# Patient Record
Sex: Male | Born: 1940 | Race: White | Hispanic: No | Marital: Married | Smoking: Former smoker
Health system: Southern US, Community
[De-identification: ages and names within clinical notes are randomized; demographics above are authoritative.]

## PROBLEM LIST (undated history)

## (undated) DIAGNOSIS — R739 Hyperglycemia, unspecified: Secondary | ICD-10-CM

## (undated) DIAGNOSIS — I10 Essential (primary) hypertension: Secondary | ICD-10-CM

## (undated) DIAGNOSIS — C801 Malignant (primary) neoplasm, unspecified: Secondary | ICD-10-CM

## (undated) DIAGNOSIS — Z87442 Personal history of urinary calculi: Secondary | ICD-10-CM

## (undated) HISTORY — PX: TONSILLECTOMY: SUR1361

## (undated) HISTORY — PX: APPENDECTOMY: SHX54

## (undated) HISTORY — PX: KNEE ARTHROSCOPY: SHX127

## (undated) HISTORY — PX: VASECTOMY: SHX75

---

## 2013-10-27 ENCOUNTER — Encounter: Payer: Self-pay | Admitting: Radiation Oncology

## 2013-10-28 ENCOUNTER — Ambulatory Visit
Admission: RE | Admit: 2013-10-28 | Discharge: 2013-10-28 | Disposition: A | Discharge status: Home / Self Care | Payer: Self-pay | Source: Ambulatory Visit | Attending: Radiation Oncology | Admitting: Radiation Oncology

## 2013-10-28 ENCOUNTER — Ambulatory Visit: Payer: Medicare HMO

## 2013-12-07 ENCOUNTER — Other Ambulatory Visit: Payer: Self-pay | Admitting: Urology

## 2013-12-17 ENCOUNTER — Encounter (HOSPITAL_COMMUNITY): Payer: Self-pay | Admitting: Pharmacy Technician

## 2013-12-23 ENCOUNTER — Encounter (HOSPITAL_COMMUNITY): Payer: Self-pay

## 2013-12-23 ENCOUNTER — Encounter (HOSPITAL_COMMUNITY)
Admission: RE | Admit: 2013-12-23 | Discharge: 2013-12-23 | Disposition: A | Discharge status: Home / Self Care | Payer: Medicare HMO | Source: Ambulatory Visit | Attending: Urology | Admitting: Urology

## 2013-12-23 ENCOUNTER — Ambulatory Visit (HOSPITAL_COMMUNITY)
Admission: RE | Admit: 2013-12-23 | Discharge: 2013-12-23 | Disposition: A | Discharge status: Home / Self Care | Payer: Medicare HMO | Source: Ambulatory Visit | Attending: Urology | Admitting: Urology

## 2013-12-23 DIAGNOSIS — M47814 Spondylosis without myelopathy or radiculopathy, thoracic region: Secondary | ICD-10-CM | POA: Insufficient documentation

## 2013-12-23 DIAGNOSIS — R739 Hyperglycemia, unspecified: Secondary | ICD-10-CM

## 2013-12-23 DIAGNOSIS — I498 Other specified cardiac arrhythmias: Secondary | ICD-10-CM | POA: Insufficient documentation

## 2013-12-23 DIAGNOSIS — C801 Malignant (primary) neoplasm, unspecified: Secondary | ICD-10-CM

## 2013-12-23 DIAGNOSIS — Z01810 Encounter for preprocedural cardiovascular examination: Secondary | ICD-10-CM | POA: Insufficient documentation

## 2013-12-23 DIAGNOSIS — Z01812 Encounter for preprocedural laboratory examination: Secondary | ICD-10-CM | POA: Insufficient documentation

## 2013-12-23 DIAGNOSIS — C61 Malignant neoplasm of prostate: Secondary | ICD-10-CM | POA: Insufficient documentation

## 2013-12-23 DIAGNOSIS — Z01818 Encounter for other preprocedural examination: Secondary | ICD-10-CM | POA: Insufficient documentation

## 2013-12-23 DIAGNOSIS — F17200 Nicotine dependence, unspecified, uncomplicated: Secondary | ICD-10-CM | POA: Insufficient documentation

## 2013-12-23 HISTORY — DX: Personal history of urinary calculi: Z87.442

## 2013-12-23 HISTORY — DX: Hyperglycemia, unspecified: R73.9

## 2013-12-23 HISTORY — DX: Essential (primary) hypertension: I10

## 2013-12-23 HISTORY — DX: Malignant (primary) neoplasm, unspecified: C80.1

## 2013-12-23 LAB — BASIC METABOLIC PANEL
BUN: 16 mg/dL (ref 6–23)
CHLORIDE: 104 meq/L (ref 96–112)
CO2: 26 mEq/L (ref 19–32)
CREATININE: 0.88 mg/dL (ref 0.50–1.35)
Calcium: 9.3 mg/dL (ref 8.4–10.5)
GFR calc Af Amer: >90 mL/min (ref >90)
GFR calc non Af Amer: 84 mL/min — ABNORMAL LOW (ref >90)
Glucose, Bld: 117 mg/dL — ABNORMAL HIGH (ref 70–99)
Potassium: 3.8 mEq/L (ref 3.7–5.3)
Sodium: 141 mEq/L (ref 137–147)

## 2013-12-23 LAB — CBC
HEMATOCRIT: 53 % — AB (ref 39.0–52.0)
Hemoglobin: 18.2 g/dL — ABNORMAL HIGH (ref 13.0–17.0)
MCH: 31.1 pg (ref 26.0–34.0)
MCHC: 34.3 g/dL (ref 30.0–36.0)
MCV: 90.6 fL (ref 78.0–100.0)
Platelets: 271 10*3/uL (ref 150–400)
RBC: 5.85 MIL/uL — ABNORMAL HIGH (ref 4.22–5.81)
RDW: 14 % (ref 11.5–15.5)
WBC: 7.3 10*3/uL (ref 4.0–10.5)

## 2013-12-23 NOTE — Pre-Procedure Instructions (Signed)
12-23-13 CXR/ EKG done today.

## 2013-12-23 NOTE — Patient Instructions (Addendum)
[place]  12/23/2013   Your procedure is scheduled on:   -12-30-2013  Report to [facility] at     [place]   AM.  Call this number if you have problems the morning of surgery: [phone]  Or Presurgical Testing [phone](Amorita Vanrossum) For Living Will and/or Health Care Power Attorney Forms: please provide copy for your medical record,may bring AM of surgery(Forms should be already notarized -we do not provide this service).  Remember: Follow any bowel prep instructions per MD office. For Cpap use: Bring mask and tubing only.   Do not eat food:After Midnight.    Take these medicines the morning of surgery with A SIP OF WATER: none.   Do not wear jewelry, make-up or nail polish.  Do not wear lotions, powders, or perfumes. You may wear deodorant.  Do not shave 48 hours(2 days) prior to first CHG shower(legs and under arms).(Shaving face and neck okay.)  Do not bring valuables to the hospital.(Hospital is not responsible for lost valuables).  Contacts, dentures or removable bridgework, body piercing, hair pins may not be worn into surgery.  Leave suitcase in the car. After surgery it may be brought to your room.  For patients admitted to the hospital, checkout time is 11:00 AM the day of discharge.(Restricted visitors-Any Persons displaying flu-like symptoms or illness).    Patients discharged the day of surgery will not be allowed to drive home. Must have responsible person with you x 24 hours once discharged.  Name and phone number of your driver: Ediel Unangst- spouse [phone] cell  Special Instructions: CHG(Chlorhedine 4%-"Hibiclens","Betasept","Aplicare") Shower Use Special Wash: see special instructions.(avoid face and genitals)   Please read over the following fact sheets that you were given: Blood Transfusion fact sheet, Incentive Spirometry Instruction.  Remember : Type/Screen "Blue armbands" - may not be removed once applied(would result in being retested AM of  surgery, if removed).  Failure to follow these instructions may result in Cancellation of your surgery.   Patient signature_______________________________________________________

## 2013-12-29 NOTE — H&P (Signed)
  History of Present Illness Benjamin Krueger is a 73 year old gentleman who was noted to have an elevated PSA of 5.4 prompting a prostate needle biopsy on 10/07/13 which demonstrated Gleason 3+4=7 adenocarcinoma of the prostate with 5 out of 12 biopsy cores positive for malignancy. Dr. Nila Nephew ordered a CT scan of the abdomen and pelvis on 10/19/13 which did not demonstrate pelvic lymphadenopathy to suggest metastatic disease but did demonstrate a 1.5 cm right inguinal lymph node and a 1.6 cm left inguinal lymph node. These nodes were apparently palpable but have resolved after antibiotic therapy. Benjamin Krueger is very healthy with hypertension as his only comorbid condition.   TNM stage: cT2a N0 Mx (mild induration of right medial base) PSA: 5.4 Gleason score: 3+4=7 Biopsy (10/07/13 - read by Dr. Otho Darner, [facility], Acc # [id number])   Right: R apex (75%, 3+4=7), R lateral apex (50%, 3+3=6), R mid (85%, 3+4=7), R lateral mid (90%, 3+4=7), R base (75%, 3+4=7) Prostate volume: 32 cc  Nomogram OC disease: 63% EPE: 34% LNI: 4% SVI: 5% PFS (surgery): 83% at 5 years, 72% at 10 years   Urinary function: He has moderate symptoms including urinary urgency, frequency, and a weak stream. IPSS is 13 Erectile function: He has severe erectile dysfunction. SHIM score is 21 but he states that he has a very difficult time obtaining an erection when speaking with him.   Past Medical History Problems  1. History of arthritis (V13.4) 2. History of diabetes mellitus (V12.29) 3. History of hypertension (V12.59) 4. History of malignant neoplasm (V10.90)  Surgical History Problems  1. History of Knee Surgery Left  Current Meds 1. Aspirin 81 MG Oral Tablet;  Therapy: (Recorded:24Feb2015) to Recorded 2. Cinnamon CAPS;  Therapy: (Recorded:24Feb2015) to Recorded 3. Hydrochlorothiazide 25 MG Oral Tablet;  Therapy: [id number] to Recorded  Allergies Medication  1. No Known Drug Allergies  Family  History Problems  1. No pertinent family history : Mother  Social History Problems    Denied: History of Alcohol use   Caffeine use (V49.89)   Current every day smoker (305.1)   Death in the family, father   age 62 respiratory   Death in the family, mother   age 47   Married   Retired   Two children  Review of Systems Constitutional, skin, eye, otolaryngeal, hematologic/lymphatic, cardiovascular, pulmonary, endocrine, musculoskeletal, gastrointestinal, neurological and psychiatric system(s) were reviewed and pertinent findings if present are noted.  Constitutional: night sweats.  Integumentary: pruritus.  Musculoskeletal: joint pain.    Vitals Vi Height: 5 ft 10 in Weight: 192 lb  BMI Calculated: 27.55 BSA Calculated: 2.05   Physical Exam Constitutional: Well nourished and well developed . No acute distress.  ENT:. The ears and nose are normal in appearance.  Neck: The appearance of the neck is normal and no neck mass is present.  Pulmonary: No respiratory distress, normal respiratory rhythm and effort and clear bilateral breath sounds.  Cardiovascular: Heart rate and rhythm are normal . No peripheral edema.  Abdomen: The abdomen is soft and nontender. No masses are palpated. No CVA tenderness. No hernias are palpable. No hepatosplenomegaly noted.        Assessment Assessed  1. Prostate cancer (185)    Discussion/Summary 1. Prostate Cancer: He will undergo a left nerve sparing robotic assisted laparoscopic radical prostatectomy and bilateral pelvic lymphadenectomy.

## 2013-12-29 NOTE — Anesthesia Preprocedure Evaluation (Addendum)
Anesthesia Evaluation  Patient identified by MRN, date of birth, ID band Patient awake    Reviewed: Allergy & Precautions, H&P , NPO status , Patient's Chart, lab work & pertinent test results  Airway Mallampati: II TM Distance: >3 FB Neck ROM: Full    Dental  (+) Edentulous Upper, Poor Dentition, Caps, Dental Advisory Given   Pulmonary neg pulmonary ROS, Current Smoker,  breath sounds clear to auscultation  Pulmonary exam normal       Cardiovascular hypertension, Pt. on medications negative cardio ROS  Rhythm:Regular Rate:Normal     Neuro/Psych negative neurological ROS  negative psych ROS   GI/Hepatic negative GI ROS, Neg liver ROS,   Endo/Other  negative endocrine ROS  Renal/GU negative Renal ROS  negative genitourinary   Musculoskeletal negative musculoskeletal ROS (+)   Abdominal   Peds  Hematology negative hematology ROS (+)   Anesthesia Other Findings   Reproductive/Obstetrics                          Anesthesia Physical Anesthesia Plan  ASA: II  Anesthesia Plan: General   Post-op Pain Management:    Induction: Intravenous  Airway Management Planned: Oral ETT  Additional Equipment:   Intra-op Plan:   Post-operative Plan: Extubation in OR  Informed Consent: I have reviewed the patients History and Physical, chart, labs and discussed the procedure including the risks, benefits and alternatives for the proposed anesthesia with the patient or authorized representative who has indicated his/her understanding and acceptance.   Dental advisory given  Plan Discussed with: CRNA  Anesthesia Plan Comments:         Anesthesia Quick Evaluation

## 2013-12-30 ENCOUNTER — Inpatient Hospital Stay (HOSPITAL_COMMUNITY)
Admission: RE | Admit: 2013-12-30 | Discharge: 2013-12-31 | DRG: 708 | Disposition: A | Discharge status: Home / Self Care | Payer: Medicare HMO | Source: Ambulatory Visit | Attending: Urology | Admitting: Urology

## 2013-12-30 ENCOUNTER — Inpatient Hospital Stay (HOSPITAL_COMMUNITY): Payer: Medicare HMO | Admitting: Anesthesiology

## 2013-12-30 ENCOUNTER — Encounter (HOSPITAL_COMMUNITY)
Admission: RE | Disposition: A | Discharge status: Home / Self Care | Payer: Self-pay | Source: Ambulatory Visit | Attending: Urology

## 2013-12-30 ENCOUNTER — Encounter (HOSPITAL_COMMUNITY): Payer: Medicare HMO | Admitting: Anesthesiology

## 2013-12-30 ENCOUNTER — Encounter (HOSPITAL_COMMUNITY): Payer: Self-pay | Admitting: *Deleted

## 2013-12-30 DIAGNOSIS — C61 Malignant neoplasm of prostate: Principal | ICD-10-CM | POA: Diagnosis present

## 2013-12-30 DIAGNOSIS — N529 Male erectile dysfunction, unspecified: Secondary | ICD-10-CM | POA: Diagnosis present

## 2013-12-30 DIAGNOSIS — Z7982 Long term (current) use of aspirin: Secondary | ICD-10-CM

## 2013-12-30 DIAGNOSIS — R39198 Other difficulties with micturition: Secondary | ICD-10-CM | POA: Diagnosis present

## 2013-12-30 DIAGNOSIS — F17200 Nicotine dependence, unspecified, uncomplicated: Secondary | ICD-10-CM | POA: Diagnosis present

## 2013-12-30 DIAGNOSIS — E119 Type 2 diabetes mellitus without complications: Secondary | ICD-10-CM | POA: Diagnosis present

## 2013-12-30 DIAGNOSIS — M129 Arthropathy, unspecified: Secondary | ICD-10-CM | POA: Diagnosis present

## 2013-12-30 DIAGNOSIS — R3915 Urgency of urination: Secondary | ICD-10-CM | POA: Diagnosis present

## 2013-12-30 DIAGNOSIS — N3289 Other specified disorders of bladder: Secondary | ICD-10-CM | POA: Diagnosis not present

## 2013-12-30 DIAGNOSIS — I10 Essential (primary) hypertension: Secondary | ICD-10-CM | POA: Diagnosis present

## 2013-12-30 HISTORY — PX: ROBOT ASSISTED LAPAROSCOPIC RADICAL PROSTATECTOMY: SHX5141

## 2013-12-30 HISTORY — PX: LYMPHADENECTOMY: SHX5960

## 2013-12-30 LAB — TYPE AND SCREEN
ABO/RH(D): O POS
Antibody Screen: NEGATIVE

## 2013-12-30 LAB — ABO/RH: ABO/RH(D): O POS

## 2013-12-30 LAB — HEMOGLOBIN AND HEMATOCRIT, BLOOD
HCT: 52.4 % — ABNORMAL HIGH (ref 39.0–52.0)
Hemoglobin: 17.8 g/dL — ABNORMAL HIGH (ref 13.0–17.0)

## 2013-12-30 SURGERY — ROBOTIC ASSISTED LAPAROSCOPIC RADICAL PROSTATECTOMY LEVEL 2
Anesthesia: General

## 2013-12-30 MED ORDER — HEPARIN SODIUM (PORCINE) 1000 UNIT/ML IJ SOLN
INTRAMUSCULAR | Status: AC
Start: 2013-12-30 — End: 2013-12-30
  Filled 2013-12-30: qty 1

## 2013-12-30 MED ORDER — PROPOFOL 10 MG/ML IV BOLUS
INTRAVENOUS | Status: AC
  Filled 2013-12-30: qty 20

## 2013-12-30 MED ORDER — EPHEDRINE SULFATE 50 MG/ML IJ SOLN
INTRAMUSCULAR | Status: DC | PRN
  Administered 2013-12-30: 7.5 mg via INTRAVENOUS

## 2013-12-30 MED ORDER — CEFAZOLIN SODIUM-DEXTROSE 2-3 GM-% IV SOLR
INTRAVENOUS | Status: AC
  Filled 2013-12-30: qty 50

## 2013-12-30 MED ORDER — SODIUM CHLORIDE 0.9 % IV BOLUS (SEPSIS)
1000.0000 mL | Freq: Once | INTRAVENOUS | Status: AC
  Administered 2013-12-30: 1000 mL via INTRAVENOUS

## 2013-12-30 MED ORDER — HEPARIN SODIUM (PORCINE) 1000 UNIT/ML IJ SOLN
INTRAMUSCULAR | Status: DC | PRN
  Administered 2013-12-30: 07:00:00

## 2013-12-30 MED ORDER — PROMETHAZINE HCL 25 MG/ML IJ SOLN: 6.2500 mg | INTRAMUSCULAR | Status: DC | PRN

## 2013-12-30 MED ORDER — CISATRACURIUM BESYLATE (PF) 10 MG/5ML IV SOLN
INTRAVENOUS | Status: DC | PRN
  Administered 2013-12-30: 4 mg via INTRAVENOUS
  Administered 2013-12-30: 8 mg via INTRAVENOUS
  Administered 2013-12-30: 4 mg via INTRAVENOUS

## 2013-12-30 MED ORDER — LACTATED RINGERS IV SOLN
INTRAVENOUS | Status: DC | PRN
  Administered 2013-12-30: 07:00:00 via INTRAVENOUS

## 2013-12-30 MED ORDER — SODIUM CHLORIDE 0.9 % IV SOLN
INTRAVENOUS | Status: DC | PRN
  Administered 2013-12-30: 10:00:00 via INTRAVENOUS

## 2013-12-30 MED ORDER — PROPOFOL 10 MG/ML IV BOLUS
INTRAVENOUS | Status: DC | PRN
  Administered 2013-12-30: 160 mg via INTRAVENOUS

## 2013-12-30 MED ORDER — MORPHINE SULFATE 2 MG/ML IJ SOLN
2.0000 mg | INTRAMUSCULAR | Status: DC | PRN
  Administered 2013-12-30 (×2): 2 mg via INTRAVENOUS
  Filled 2013-12-30 (×3): qty 1

## 2013-12-30 MED ORDER — EPHEDRINE SULFATE 50 MG/ML IJ SOLN
INTRAMUSCULAR | Status: AC
  Filled 2013-12-30: qty 1

## 2013-12-30 MED ORDER — LACTATED RINGERS IV SOLN: INTRAVENOUS | Status: DC

## 2013-12-30 MED ORDER — DEXAMETHASONE SODIUM PHOSPHATE 10 MG/ML IJ SOLN
INTRAMUSCULAR | Status: DC | PRN
  Administered 2013-12-30: 10 mg via INTRAVENOUS

## 2013-12-30 MED ORDER — GLYCOPYRROLATE 0.2 MG/ML IJ SOLN
INTRAMUSCULAR | Status: AC
  Filled 2013-12-30: qty 1

## 2013-12-30 MED ORDER — DIPHENHYDRAMINE HCL 50 MG/ML IJ SOLN: 12.5000 mg | Freq: Four times a day (QID) | INTRAMUSCULAR | Status: DC | PRN

## 2013-12-30 MED ORDER — CEFAZOLIN SODIUM 1-5 GM-% IV SOLN
1.0000 g | Freq: Three times a day (TID) | INTRAVENOUS | Status: AC
  Administered 2013-12-30 (×2): 1 g via INTRAVENOUS
  Filled 2013-12-30 (×2): qty 50

## 2013-12-30 MED ORDER — HYDROCODONE-ACETAMINOPHEN 5-325 MG PO TABS: 1.0000 | ORAL_TABLET | Freq: Four times a day (QID) | ORAL | Status: AC | PRN

## 2013-12-30 MED ORDER — BUPIVACAINE-EPINEPHRINE PF 0.25-1:200000 % IJ SOLN
INTRAMUSCULAR | Status: AC
  Filled 2013-12-30: qty 30

## 2013-12-30 MED ORDER — SODIUM CHLORIDE 0.9 % IR SOLN
Status: DC | PRN
  Administered 2013-12-30: 1000 mL

## 2013-12-30 MED ORDER — KETOROLAC TROMETHAMINE 15 MG/ML IJ SOLN
15.0000 mg | Freq: Four times a day (QID) | INTRAMUSCULAR | Status: DC
  Administered 2013-12-30 – 2013-12-31 (×4): 15 mg via INTRAVENOUS
  Filled 2013-12-30 (×4): qty 1

## 2013-12-30 MED ORDER — VITAMINS A & D EX OINT
TOPICAL_OINTMENT | CUTANEOUS | Status: AC
  Administered 2013-12-30: 5
  Filled 2013-12-30: qty 5

## 2013-12-30 MED ORDER — CISATRACURIUM BESYLATE 20 MG/10ML IV SOLN
INTRAVENOUS | Status: AC
  Filled 2013-12-30: qty 10

## 2013-12-30 MED ORDER — HYDROMORPHONE HCL PF 1 MG/ML IJ SOLN
0.2500 mg | INTRAMUSCULAR | Status: DC | PRN
  Administered 2013-12-30 (×2): 0.5 mg via INTRAVENOUS

## 2013-12-30 MED ORDER — SODIUM CHLORIDE 0.9 % IJ SOLN
INTRAMUSCULAR | Status: AC
  Filled 2013-12-30: qty 10

## 2013-12-30 MED ORDER — GLYCOPYRROLATE 0.2 MG/ML IJ SOLN
INTRAMUSCULAR | Status: AC
  Filled 2013-12-30: qty 3

## 2013-12-30 MED ORDER — BUPIVACAINE-EPINEPHRINE 0.25% -1:200000 IJ SOLN
INTRAMUSCULAR | Status: DC | PRN
  Administered 2013-12-30: 28 mL

## 2013-12-30 MED ORDER — DEXAMETHASONE SODIUM PHOSPHATE 10 MG/ML IJ SOLN
INTRAMUSCULAR | Status: AC
  Filled 2013-12-30: qty 1

## 2013-12-30 MED ORDER — STERILE WATER FOR IRRIGATION IR SOLN
Status: DC | PRN
  Administered 2013-12-30: 3000 mL

## 2013-12-30 MED ORDER — DIPHENHYDRAMINE HCL 12.5 MG/5ML PO ELIX: 12.5000 mg | ORAL_SOLUTION | Freq: Four times a day (QID) | ORAL | Status: DC | PRN

## 2013-12-30 MED ORDER — FENTANYL CITRATE 0.05 MG/ML IJ SOLN
INTRAMUSCULAR | Status: AC
  Filled 2013-12-30: qty 5

## 2013-12-30 MED ORDER — CEFAZOLIN SODIUM-DEXTROSE 2-3 GM-% IV SOLR
2.0000 g | INTRAVENOUS | Status: AC
  Administered 2013-12-30: 2 g via INTRAVENOUS

## 2013-12-30 MED ORDER — ONDANSETRON HCL 4 MG/2ML IJ SOLN
INTRAMUSCULAR | Status: DC | PRN
  Administered 2013-12-30: 4 mg via INTRAVENOUS

## 2013-12-30 MED ORDER — NEOSTIGMINE METHYLSULFATE 1 MG/ML IJ SOLN
INTRAMUSCULAR | Status: DC | PRN
  Administered 2013-12-30: 5 mg via INTRAVENOUS

## 2013-12-30 MED ORDER — CIPROFLOXACIN HCL 500 MG PO TABS: 500.0000 mg | ORAL_TABLET | Freq: Two times a day (BID) | ORAL | Status: AC

## 2013-12-30 MED ORDER — ACETAMINOPHEN 325 MG PO TABS: 650.0000 mg | ORAL_TABLET | ORAL | Status: DC | PRN

## 2013-12-30 MED ORDER — KCL IN DEXTROSE-NACL 20-5-0.45 MEQ/L-%-% IV SOLN
INTRAVENOUS | Status: AC
  Administered 2013-12-30: 1000 mL
  Filled 2013-12-30: qty 1000

## 2013-12-30 MED ORDER — HYDROCHLOROTHIAZIDE 25 MG PO TABS
25.0000 mg | ORAL_TABLET | Freq: Every morning | ORAL | Status: DC
  Administered 2013-12-30 – 2013-12-31 (×2): 25 mg via ORAL
  Filled 2013-12-30 (×2): qty 1

## 2013-12-30 MED ORDER — ONDANSETRON HCL 4 MG/2ML IJ SOLN
INTRAMUSCULAR | Status: AC
  Filled 2013-12-30: qty 2

## 2013-12-30 MED ORDER — KCL IN DEXTROSE-NACL 20-5-0.45 MEQ/L-%-% IV SOLN
INTRAVENOUS | Status: DC
  Administered 2013-12-30 (×3): via INTRAVENOUS
  Filled 2013-12-30 (×5): qty 1000

## 2013-12-30 MED ORDER — HYDROMORPHONE HCL PF 1 MG/ML IJ SOLN
INTRAMUSCULAR | Status: AC
  Filled 2013-12-30: qty 1

## 2013-12-30 MED ORDER — GLYCOPYRROLATE 0.2 MG/ML IJ SOLN
INTRAMUSCULAR | Status: DC | PRN
  Administered 2013-12-30: .8 mg via INTRAVENOUS

## 2013-12-30 MED ORDER — KETOROLAC TROMETHAMINE 15 MG/ML IJ SOLN
INTRAMUSCULAR | Status: AC
  Filled 2013-12-30: qty 1

## 2013-12-30 MED ORDER — HYDRALAZINE HCL 20 MG/ML IJ SOLN
INTRAMUSCULAR | Status: AC
  Filled 2013-12-30: qty 1

## 2013-12-30 MED ORDER — DOCUSATE SODIUM 100 MG PO CAPS
100.0000 mg | ORAL_CAPSULE | Freq: Two times a day (BID) | ORAL | Status: DC
  Administered 2013-12-30 – 2013-12-31 (×2): 100 mg via ORAL
  Filled 2013-12-30 (×3): qty 1

## 2013-12-30 MED ORDER — FENTANYL CITRATE 0.05 MG/ML IJ SOLN
INTRAMUSCULAR | Status: DC | PRN
  Administered 2013-12-30 (×2): 50 ug via INTRAVENOUS
  Administered 2013-12-30: 100 ug via INTRAVENOUS

## 2013-12-30 SURGICAL SUPPLY — 45 items
CABLE HIGH FREQUENCY MONO STRZ (ELECTRODE) ×4 IMPLANT
CATH FOLEY 2WAY SLVR 18FR 30CC (CATHETERS) ×4 IMPLANT
CATH ROBINSON RED A/P 16FR (CATHETERS) ×4 IMPLANT
CATH ROBINSON RED A/P 8FR (CATHETERS) ×4 IMPLANT
CATH TIEMANN FOLEY 18FR 5CC (CATHETERS) ×4 IMPLANT
CHLORAPREP W/TINT 26ML (MISCELLANEOUS) ×4 IMPLANT
CLIP LIGATING HEM O LOK PURPLE (MISCELLANEOUS) ×8 IMPLANT
CLOTH BEACON ORANGE TIMEOUT ST (SAFETY) ×4 IMPLANT
COVER SURGICAL LIGHT HANDLE (MISCELLANEOUS) ×8 IMPLANT
COVER TIP SHEARS 8 DVNC (MISCELLANEOUS) ×2 IMPLANT
COVER TIP SHEARS 8MM DA VINCI (MISCELLANEOUS) ×2
CUTTER ECHEON FLEX ENDO 45 340 (ENDOMECHANICALS) ×4 IMPLANT
DECANTER SPIKE VIAL GLASS SM (MISCELLANEOUS) IMPLANT
DERMABOND ADVANCED (GAUZE/BANDAGES/DRESSINGS) ×2
DERMABOND ADVANCED .7 DNX12 (GAUZE/BANDAGES/DRESSINGS) ×2 IMPLANT
DRAPE SURG IRRIG POUCH 19X23 (DRAPES) ×4 IMPLANT
DRSG TEGADERM 4X4.75 (GAUZE/BANDAGES/DRESSINGS) ×4 IMPLANT
DRSG TEGADERM 6X8 (GAUZE/BANDAGES/DRESSINGS) ×8 IMPLANT
ELECT REM PT RETURN 9FT ADLT (ELECTROSURGICAL) ×4
ELECTRODE REM PT RTRN 9FT ADLT (ELECTROSURGICAL) ×2 IMPLANT
GLOVE BIO SURGEON STRL SZ 6.5 (GLOVE) ×3 IMPLANT
GLOVE BIO SURGEONS STRL SZ 6.5 (GLOVE) ×1
GLOVE BIOGEL M STRL SZ7.5 (GLOVE) ×8 IMPLANT
GOWN STRL REIN XL XLG (GOWN DISPOSABLE) ×4 IMPLANT
GOWN STRL REUS W/ TWL LRG LVL3 (GOWN DISPOSABLE) ×2 IMPLANT
GOWN STRL REUS W/TWL LRG LVL3 (GOWN DISPOSABLE) ×18 IMPLANT
GOWN STRL REUS W/TWL XL LVL3 (GOWN DISPOSABLE) IMPLANT
HOLDER FOLEY CATH W/STRAP (MISCELLANEOUS) ×4 IMPLANT
IV LACTATED RINGERS 1000ML (IV SOLUTION) IMPLANT
KIT ACCESSORY DA VINCI DISP (KITS) ×2
KIT ACCESSORY DVNC DISP (KITS) ×2 IMPLANT
MANIFOLD NEPTUNE II (INSTRUMENTS) ×4 IMPLANT
NDL SAFETY ECLIPSE 18X1.5 (NEEDLE) ×2 IMPLANT
NEEDLE HYPO 18GX1.5 SHARP (NEEDLE) ×2
PACK ROBOT UROLOGY CUSTOM (CUSTOM PROCEDURE TRAY) ×4 IMPLANT
RELOAD GREEN ECHELON 45 (STAPLE) ×4 IMPLANT
SET TUBE IRRIG SUCTION NO TIP (IRRIGATION / IRRIGATOR) ×4 IMPLANT
SOLUTION ELECTROLUBE (MISCELLANEOUS) ×4 IMPLANT
SUT ETHILON 3 0 PS 1 (SUTURE) ×4 IMPLANT
SUT MNCRL AB 4-0 PS2 18 (SUTURE) ×8 IMPLANT
SUT VICRYL 0 UR6 27IN ABS (SUTURE) ×8 IMPLANT
SYR 27GX1/2 1ML LL SAFETY (SYRINGE) ×4 IMPLANT
TOWEL OR 17X26 10 PK STRL BLUE (TOWEL DISPOSABLE) ×4 IMPLANT
TOWEL OR NON WOVEN STRL DISP B (DISPOSABLE) ×4 IMPLANT
WATER STERILE IRR 1500ML POUR (IV SOLUTION) IMPLANT

## 2013-12-30 NOTE — Progress Notes (Signed)
Patient C/O of increase discomfort around the bladder area, foley with red/bloody urine output but NO clot; bladder scan 0cc and foley irrigated as well with some relieve. Sitting up for the first time post op C/O slight dizziness that resolved immediately; patient ambulated in the hall and tolerated it well. Will continue to assess.

## 2013-12-30 NOTE — Transfer of Care (Signed)
Immediate Anesthesia Transfer of Care Note  Patient: Benjamin Krueger  Procedure(s) Performed: Procedure(s): ROBOTIC ASSISTED LAPAROSCOPIC RADICAL PROSTATECTOMY LEVEL 2 (N/A) LYMPHADENECTOMY (Bilateral)  Patient Location: PACU  Anesthesia Type:General  Level of Consciousness: awake, sedated and patient cooperative  Airway & Oxygen Therapy: Patient Spontanous Breathing and Patient connected to face mask oxygen  Post-op Assessment: Report given to PACU RN and Post -op Vital signs reviewed and stable  Post vital signs: Reviewed and stable  Complications: No apparent anesthesia complications

## 2013-12-30 NOTE — Progress Notes (Signed)
Utilization review completed.  

## 2013-12-30 NOTE — Progress Notes (Signed)
Patient admitted from PACU to 1434, arousable and denies any pain, foley inplace draining red color urine., JP-drain charged with reinforced dressing from PACU clean and intact, other surgical incision clean/dry/intact. Oriented patient to room/unit and reviewed plan of care. Will continue to monitor.

## 2013-12-30 NOTE — Discharge Instructions (Signed)
1. Activity:  You are encouraged to ambulate frequently (about every hour during waking hours) to help prevent blood clots from forming in your legs or lungs.  However, you should not engage in any heavy lifting (> 10-15 lbs), strenuous activity, or straining. °2. Diet: You should continue a clear liquid diet until passing gas from below.  Once this occurs, you may advance your diet to a soft diet that would be easy to digest (i.e soups, scrambled eggs, mashed potatoes, etc.) for 24 hours just as you would if getting over a bad stomach flu.  If tolerating this diet well for 24 hours, you may then begin eating regular food.  It will be normal to have some amount of bloating, nausea, and abdominal discomfort intermittently. °3. Prescriptions:  You will be provided a prescription for pain medication to take as needed.  If your pain is not severe enough to require the prescription pain medication, you may take extra strength Tylenol instead.  You should also take an over the counter stool softener (Colace 100 mg twice daily) to avoid straining with bowel movements as the pain medication may constipate you. Finally, you will also be provided a prescription for an antibiotic to begin the day prior to your return visit in the office for catheter removal. °4. Catheter care: You will be taught how to take care of the catheter by the nursing staff prior to discharge from the hospital.  You may use both a leg bag and the larger bedside bag but it is recommended to at least use the bigger bedside bag at nighttime as the leg bag is small and will fill up overnight and also does not drain as well when lying flat. You may periodically feel a strong urge to void with the catheter in place.  This is a bladder spasm and most often can occur when having a bowel movement or when you are moving around. It is typically self-limited and usually will stop after a few minutes.  You may use some Vaseline or Neosporin around the tip of the  catheter to reduce friction at the tip of the penis. °5. Incisions: You may remove your dressing bandages the 2nd day after surgery.  You most likely will have a few small staples in each of the incisions and once the bandages are removed, the incisions may stay open to air.  You may start showering (not soaking or bathing in water) 48 hours after surgery and the incisions simply need to be patted dry after the shower.  No additional care is needed. °6. What to call us about: You should call the office ([phone]) if you develop fever > 101, persistent vomiting, or the catheter stops draining. Also, feel free to call with any other questions you may have and remember the handout that was provided to you as a reference preoperatively which answers many of the common questions that arise after surgery. ° °You may resume aspirin, vitamins, and supplements 7 days after surgery. °

## 2013-12-30 NOTE — Progress Notes (Signed)
Hgb. And Hct. Drawn by lab. 

## 2013-12-30 NOTE — Op Note (Signed)
Preoperative diagnosis: Clinically localized adenocarcinoma of the prostate (clinical stage T2a N0 Mx)  Postoperative diagnosis: Clinically localized adenocarcinoma of the prostate (clinical stage T2a N0 Mx)  Procedure:  1. Robotic assisted laparoscopic radical prostatectomy (left nerve sparing) 2. Bilateral robotic assisted laparoscopic pelvic lymphadenectomy  Surgeon: [name]. M.D.  Assistant(s): Leta Baptist, PA-C  Anesthesia: General  Complications: None  EBL: 100 mL  IVF:  1000 mL crystalloid  Specimens: 1. Prostate and seminal vesicles 2. Right pelvic lymph nodes 3. Left pelvic lymph nodes  Disposition of specimens: Pathology  Drains: 1. 20 Fr coude catheter 2. # 19 Blake pelvic drain  Indication: Benjamin Krueger is a 73 y.o. patient with clinically localized prostate cancer.  After a thorough review of the management options for treatment of prostate cancer, he elected to proceed with surgical therapy and the above procedure(s).  We have discussed the potential benefits and risks of the procedure, side effects of the proposed treatment, the likelihood of the patient achieving the goals of the procedure, and any potential problems that might occur during the procedure or recuperation. Informed consent has been obtained.  Description of procedure:  The patient was taken to the operating room and a general anesthetic was administered. He was given preoperative antibiotics, placed in the dorsal lithotomy position, and prepped and draped in the usual sterile fashion. Next a preoperative timeout was performed. A urethral catheter was placed into the bladder and a site was selected near the umbilicus for placement of the camera port. This was placed using a standard open Hassan technique which allowed entry into the peritoneal cavity under direct vision and without difficulty. A 12 mm port was placed and a pneumoperitoneum established. The camera was then used to  inspect the abdomen and there was no evidence of any intra-abdominal injuries or other abnormalities. The remaining abdominal ports were then placed. 8 mm robotic ports were placed in the right lower quadrant, left lower quadrant, and far left lateral abdominal wall. A 5 mm port was placed in the right upper quadrant and a 12 mm port was placed in the right lateral abdominal wall for laparoscopic assistance. All ports were placed under direct vision without difficulty. The surgical cart was then docked.   Utilizing the cautery scissors, the bladder was reflected posteriorly allowing entry into the space of Retzius and identification of the endopelvic fascia and prostate. The periprostatic fat was then removed from the prostate allowing full exposure of the endopelvic fascia. The endopelvic fascia was then incised from the apex back to the base of the prostate bilaterally and the underlying levator muscle fibers were swept laterally off the prostate thereby isolating the dorsal venous complex. The dorsal vein was then stapled and divided with a 45 mm Flex Echelon stapler. Attention then turned to the bladder neck which was divided anteriorly thereby allowing entry into the bladder and exposure of the urethral catheter. The catheter balloon was deflated and the catheter was brought into the operative field and used to retract the prostate anteriorly. The posterior bladder neck was then examined and was divided allowing further dissection between the bladder and prostate posteriorly until the vasa deferentia and seminal vessels were identified. The vasa deferentia were isolated, divided, and lifted anteriorly. The seminal vesicles were dissected down to their tips with care to control the seminal vascular arterial blood supply. These structures were then lifted anteriorly and the space between Denonvillier's fascia and the anterior rectum was developed with a combination of sharp  and blunt dissection. This isolated  the vascular pedicles of the prostate.  The lateral prostatic fascia on the left side of the prostate was then sharply incised allowing release of the neurovascular bundle. The vascular pedicle of the prostate on the left side was then ligated with Weck clips between the prostate and neurovascular bundle and divided with sharp cold scissor dissection resulting in neurovascular bundle preservation. On the right side, a wide non nerve sparing dissection was performed with Weck clips used to ligate the vascular pedicle of the prostate. The neurovascular bundle on the left side was then separated off the apex of the prostate and urethra.   The urethra was then sharply transected allowing the prostate specimen to be disarticulated. The pelvis was copiously irrigated and hemostasis was ensured. There was no evidence for rectal injury.  Attention then turned to the right pelvic sidewall. The fibrofatty tissue between the external iliac vein, confluence of the iliac vessels, hypogastric artery, and Cooper's ligament was dissected free from the pelvic sidewall with care to preserve the obturator nerve. Weck clips were used for lymphostasis and hemostasis. An identical procedure was performed on the contralateral side and the lymphatic packets were removed for permanent pathologic analysis.  Attention then turned to the urethral anastomosis. A 2-0 Vicryl slip knot was placed between Denonvillier's fascia, the posterior bladder neck, and the posterior urethra to reapproximate these structures. A double-armed 3-0 Monocryl suture was then used to perform a 360 running tension-free anastomosis between the bladder neck and urethra. A new urethral catheter was then placed into the bladder and irrigated. There were no blood clots within the bladder and the anastomosis appeared to be watertight. A #19 Blake drain was then brought through the left lateral 8 mm port site and positioned appropriately within the pelvis. It was  secured to the skin with a nylon suture. The surgical cart was then undocked. The right lateral 12 mm port site was closed at the fascial level with a 0 Vicryl suture placed laparoscopically. All remaining ports were then removed under direct vision. The prostate specimen was removed intact within the Endopouch retrieval bag via the periumbilical camera port site. This fascial opening was closed with two running 0 Vicryl sutures. 0.25% Marcaine was then injected into all port sites and all incisions were reapproximated at the skin level with 4-0 Monocryl subcuticular sutures and Dermabond. The patient appeared to tolerate the procedure well and without complications. The patient was able to be extubated and transferred to the recovery unit in satisfactory condition.   [name] MD

## 2013-12-30 NOTE — Progress Notes (Signed)
Hgb. 17.8- Hct. 32.4- results noted

## 2013-12-30 NOTE — Progress Notes (Signed)
Patient ID: Benjamin Krueger, male   DOB: 1941/03/27, 73 y.o.   MRN: [id number] Post-op note  Subjective: The patient is doing well.  No complaints except some bladder spasms.  Denies N/V. Has not ambulated yet  Objective: Vital signs in last 24 hours: Temp:  [97.5 F (36.4 C)-98.7 F (37.1 C)] 97.8 F (36.6 C) (04/02 1246) Pulse Rate:  [65-81] 76 (04/02 1246) Resp:  [9-18] 12 (04/02 1246) BP: (146-181)/(77-91) 146/77 mmHg (04/02 1246) SpO2:  [96 %-100 %] 96 % (04/02 1246)  Intake/Output from previous day:   Intake/Output this shift: Total I/O In: 2625 [I.V.:1625; IV Piggyback:1000] Out: 850 [Urine:700; Drains:50; Blood:100]  Physical Exam:  General: Alert and oriented. Abdomen: Soft, Nondistended. Incisions: Clean and dry.  Lab Results:  Recent Labs  12/30/13 1045  HGB 17.8*  HCT 52.4*    Assessment/Plan: POD#0   1) Continue to monitor  2) IS, amb, B/O for spasms, clears, pain control, DVT prophy    LOS: 0 days   Marcie Bal. 12/30/2013, 2:19 PM

## 2013-12-30 NOTE — Anesthesia Postprocedure Evaluation (Signed)
Anesthesia Post Note  Patient: Benjamin Krueger  Procedure(s) Performed: Procedure(s) (LRB): ROBOTIC ASSISTED LAPAROSCOPIC RADICAL PROSTATECTOMY LEVEL 2 (N/A) LYMPHADENECTOMY (Bilateral)  Anesthesia type: General  Patient location: PACU  Post pain: Pain level controlled  Post assessment: Post-op Vital signs reviewed  Last Vitals:  Filed Vitals:   12/30/13 1246  BP: 146/77  Pulse: 76  Temp: 36.6 C  Resp: 12    Post vital signs: Reviewed  Level of consciousness: sedated  Complications: No apparent anesthesia complications

## 2013-12-30 NOTE — Progress Notes (Signed)
Patient with low urine output, notified Dr. Alinda Money, no new order given. Will continue to assess patient.

## 2013-12-31 ENCOUNTER — Encounter (HOSPITAL_COMMUNITY): Payer: Self-pay | Admitting: Urology

## 2013-12-31 LAB — HEMOGLOBIN AND HEMATOCRIT, BLOOD
HCT: 42.2 % (ref 39.0–52.0)
HEMATOCRIT: 41.3 % (ref 39.0–52.0)
HEMOGLOBIN: 13.9 g/dL (ref 13.0–17.0)
Hemoglobin: 13.9 g/dL (ref 13.0–17.0)

## 2013-12-31 MED ORDER — BISACODYL 10 MG RE SUPP
10.0000 mg | Freq: Once | RECTAL | Status: AC
  Administered 2013-12-31: 10 mg via RECTAL
  Filled 2013-12-31: qty 1

## 2013-12-31 MED ORDER — HYDROCODONE-ACETAMINOPHEN 5-325 MG PO TABS: 1.0000 | ORAL_TABLET | Freq: Four times a day (QID) | ORAL | Status: DC | PRN

## 2013-12-31 NOTE — Progress Notes (Signed)
Pt d/c from hospital at this time. Spouse at pt's side. Discharge instructions/prescriptions given/explained with pt and spouse verbalizing understanding. Foley and leg bag teaching done. Supplies sent home with pt. JP and IV d/c prior to discharge. Followup appointment noted.

## 2013-12-31 NOTE — Progress Notes (Signed)
Patient ID: Benjamin Krueger, male   DOB: 29-Sep-1941, 73 y.o.   MRN: [id number]  1 Day Post-Op Subjective: The patient is doing well.  No nausea or vomiting. Pain is adequately controlled.  Objective: Vital signs in last 24 hours: Temp:  [97.5 F (36.4 C)-98.7 F (37.1 C)] 98.7 F (37.1 C) (04/03 0357) Pulse Rate:  [64-83] 64 (04/03 0357) Resp:  [9-18] 16 (04/03 0357) BP: (135-181)/(67-91) 135/70 mmHg (04/03 0357) SpO2:  [92 %-100 %] 94 % (04/03 0357) Weight:  [90.436 kg (199 lb 6 oz)] 90.436 kg (199 lb 6 oz) (04/02 1300)  Intake/Output from previous day: 04/02 0701 - 04/03 0700 In: 7829 [P.O.:240; I.V.:4275; IV Piggyback:1050] Out: 5621 [Urine:1265; Drains:132; Blood:100] Intake/Output this shift:    Physical Exam:  General: Alert and oriented. CV: RRR Lungs: Clear bilaterally. GI: Soft, Nondistended. Incisions: Clean, dry, and intact Urine: Clear Extremities: Nontender, no erythema, no edema.  Lab Results:  Recent Labs  12/30/13 1045 12/31/13 0347  HGB 17.8* 13.9  HCT 52.4* 42.2      Assessment/Plan: POD# 1 s/p robotic prostatectomy.  1) SL IVF 2) Ambulate, Incentive spirometry 3) Transition to oral pain medication 4) Dulcolax suppository 5) Hgb normal but significant decrease from baseline. ? Hemodilution.  Will recheck later this morning.  He is otherwise HD stable. 6) Plan for likely discharge later today   [name]. MD   LOS: 1 day   Benjamin Krueger,LES 12/31/2013, 7:06 AM

## 2013-12-31 NOTE — Discharge Summary (Signed)
  Date of admission: 12/30/2013  Date of discharge: 12/31/2013  Admission diagnosis: Prostate Cancer  Discharge diagnosis: Prostate Cancer  History and Physical: For full details, please see admission history and physical. Briefly, Benjamin Krueger is a 73 y.o. gentleman with localized prostate cancer.  After discussing management/treatment options, he elected to proceed with surgical treatment.  Hospital Course: Benjamin Krueger was taken to the operating room on 12/30/2013 and underwent a robotic assisted laparoscopic radical prostatectomy. He tolerated this procedure well and without complications. Postoperatively, he was able to be transferred to a regular hospital room following recovery from anesthesia.  He was able to begin ambulating the night of surgery. He remained hemodynamically stable overnight.  He had excellent urine output with appropriately minimal output from his pelvic drain and his pelvic drain was removed on POD #1.  He was transitioned to oral pain medication, tolerated a clear liquid diet, and had met all discharge criteria and was able to be discharged home later on POD#1.  Laboratory values:  Recent Labs  12/30/13 1045 12/31/13 0347  HGB 17.8* 13.9  HCT 52.4* 42.2    Disposition: Home  Discharge instruction: He was instructed to be ambulatory but to refrain from heavy lifting, strenuous activity, or driving. He was instructed on urethral catheter care.  Discharge medications:     Medication List    STOP taking these medications       aspirin EC 81 MG tablet      TAKE these medications       ciprofloxacin 500 MG tablet  Commonly known as:  CIPRO  Take 1 tablet (500 mg total) by mouth 2 (two) times daily. Start day prior to office visit for foley removal     hydrochlorothiazide 25 MG tablet  Commonly known as:  HYDRODIURIL  Take 25 mg by mouth every morning.     HYDROcodone-acetaminophen 5-325 MG per tablet  Commonly known as:  NORCO  Take 1-2 tablets by mouth  every 6 (six) hours as needed.        Followup: He will followup in 1 week for catheter removal and to discuss his surgical pathology results.

## 2014-01-10 ENCOUNTER — Encounter (HOSPITAL_COMMUNITY): Payer: Self-pay | Admitting: Emergency Medicine

## 2014-01-10 ENCOUNTER — Emergency Department (HOSPITAL_COMMUNITY)
Admission: EM | Admit: 2014-01-10 | Discharge: 2014-01-10 | Disposition: A | Discharge status: Home / Self Care | Payer: Medicare HMO | Attending: Emergency Medicine | Admitting: Emergency Medicine

## 2014-01-10 ENCOUNTER — Emergency Department (HOSPITAL_COMMUNITY): Payer: Medicare HMO

## 2014-01-10 DIAGNOSIS — Z87442 Personal history of urinary calculi: Secondary | ICD-10-CM | POA: Insufficient documentation

## 2014-01-10 DIAGNOSIS — I10 Essential (primary) hypertension: Secondary | ICD-10-CM | POA: Insufficient documentation

## 2014-01-10 DIAGNOSIS — K567 Ileus, unspecified: Secondary | ICD-10-CM

## 2014-01-10 DIAGNOSIS — Z9079 Acquired absence of other genital organ(s): Secondary | ICD-10-CM | POA: Insufficient documentation

## 2014-01-10 DIAGNOSIS — K560 Paralytic ileus: Secondary | ICD-10-CM | POA: Insufficient documentation

## 2014-01-10 DIAGNOSIS — Z87891 Personal history of nicotine dependence: Secondary | ICD-10-CM | POA: Insufficient documentation

## 2014-01-10 DIAGNOSIS — Z8639 Personal history of other endocrine, nutritional and metabolic disease: Secondary | ICD-10-CM | POA: Insufficient documentation

## 2014-01-10 DIAGNOSIS — Z8546 Personal history of malignant neoplasm of prostate: Secondary | ICD-10-CM | POA: Insufficient documentation

## 2014-01-10 DIAGNOSIS — Z9889 Other specified postprocedural states: Secondary | ICD-10-CM | POA: Insufficient documentation

## 2014-01-10 DIAGNOSIS — Z862 Personal history of diseases of the blood and blood-forming organs and certain disorders involving the immune mechanism: Secondary | ICD-10-CM | POA: Insufficient documentation

## 2014-01-10 LAB — COMPREHENSIVE METABOLIC PANEL
ALBUMIN: 3.6 g/dL (ref 3.5–5.2)
ALT: 17 U/L (ref 0–53)
AST: 18 U/L (ref 0–37)
Alkaline Phosphatase: 53 U/L (ref 39–117)
BUN: 16 mg/dL (ref 6–23)
CALCIUM: 9.5 mg/dL (ref 8.4–10.5)
CO2: 30 mEq/L (ref 19–32)
Chloride: 94 mEq/L — ABNORMAL LOW (ref 96–112)
Creatinine, Ser: 1.11 mg/dL (ref 0.50–1.35)
GFR calc Af Amer: 75 mL/min — ABNORMAL LOW (ref >90)
GFR calc non Af Amer: 65 mL/min — ABNORMAL LOW (ref >90)
GLUCOSE: 130 mg/dL — AB (ref 70–99)
Potassium: 3.7 mEq/L (ref 3.7–5.3)
SODIUM: 136 meq/L — AB (ref 137–147)
TOTAL PROTEIN: 7.3 g/dL (ref 6.0–8.3)
Total Bilirubin: 1.2 mg/dL (ref 0.3–1.2)

## 2014-01-10 LAB — CBC WITH DIFFERENTIAL/PLATELET
BASOS ABS: 0.1 10*3/uL (ref 0.0–0.1)
BASOS PCT: 0 % (ref 0–1)
EOS ABS: 0.1 10*3/uL (ref 0.0–0.7)
EOS PCT: 1 % (ref 0–5)
HCT: 41.6 % (ref 39.0–52.0)
Hemoglobin: 14.2 g/dL (ref 13.0–17.0)
Lymphocytes Relative: 8 % — ABNORMAL LOW (ref 12–46)
Lymphs Abs: 1.2 10*3/uL (ref 0.7–4.0)
MCH: 31.3 pg (ref 26.0–34.0)
MCHC: 34.1 g/dL (ref 30.0–36.0)
MCV: 91.6 fL (ref 78.0–100.0)
Monocytes Absolute: 1.1 10*3/uL — ABNORMAL HIGH (ref 0.1–1.0)
Monocytes Relative: 7 % (ref 3–12)
Neutro Abs: 13.2 10*3/uL — ABNORMAL HIGH (ref 1.7–7.7)
Neutrophils Relative %: 85 % — ABNORMAL HIGH (ref 43–77)
Platelets: 477 10*3/uL — ABNORMAL HIGH (ref 150–400)
RBC: 4.54 MIL/uL (ref 4.22–5.81)
RDW: 13.7 % (ref 11.5–15.5)
WBC: 15.6 10*3/uL — ABNORMAL HIGH (ref 4.0–10.5)

## 2014-01-10 NOTE — Discharge Instructions (Signed)
Ileus The intestine (bowel, or gut) is a long muscular tube connecting your stomach to your rectum. If the intestine stops working, food cannot pass through. This is called an ileus. This can happen for a variety of reasons. Ileus is a major medical problem that usually requires hospitalization. If your intestine stops working because of a blockage, this is called a bowel obstruction, and is a different condition. CAUSES   Surgery in your abdomen. This can last from a few hours to a few days.  An infection or inflammation in the belly (abdomen). This includes inflammation of the lining of the abdomen (peritonitis).  Infection or inflammation in other parts of the body, such as pneumonia or pancreatitis.  Passage of gallstones or kidney stones.  Damage to the nerves or blood vessels which go to the bowel.  Imbalance in the salts in the blood (electrolytes).  Injury to the brain and or spinal cord.  Medications. Many medications can cause ileus or make it worse. The most common of these are strong pain medications. SYMPTOMS  Symptoms of bowel obstruction come from the bowel inactivity. They may include:  Bloating. Your belly gets bigger (distension).  Pain or discomfort in the abdomen.  Poor appetite, feeling sick to your stomach (nausea) and vomiting.  You may also not be able to hear your normal bowel sounds, such as "growling" in your stomach. DIAGNOSIS   Your history and a physical exam will usually suggest to your caregiver that you have an ileus.  X-rays or a CT scan of your abdomen will confirm the diagnosis. X-rays, CT scans and lab tests may also suggest the cause. TREATMENT   Rest the intestine until it starts working again. This is most often accomplished by:  Stopping intake of oral food and drink. Dehydration is prevented by using IV (intravenous) fluids.  Sometimes, a naso-gastric tube (NG tube) is needed. This is a narrow plastic tube inserted through your nose  and into your stomach. It is connected to suction to keep the stomach emptied out. This also helps treat the nausea and vomiting.  If there is an imbalance in the electrolytes, they are corrected with supplements in your intravenous fluids.  Medications that might make an ileus worse might be stopped.  There are no medications that reliably treat ileus, though your caregiver may suggest a trial of certain medications.  If your condition is slow to resolve, you will be re-evaluated to be sure another condition, such as a blockage, is not present. Ileus is common and usually has a good outcome. Depending on cause of your ileus, it usually can be treated by your caregivers with good results. Sometimes, specialists (surgeons or gastroenterologists) are asked to assist in your care.  HOME CARE INSTRUCTIONS   Follow your caregiver's instructions regarding diet and fluid intake. This will usually include drinking plenty of clear fluids, avoiding alcohol and caffeine, and eating a gentle diet.  Follow your caregiver's instructions regarding activity. A period of rest is sometimes advised before returning to work or school.  Take only medications prescribed by your caregiver. Be especially careful with narcotic pain medication, which can slow your bowel activity and contribute to ileus.  Keep any follow-up appointments with your caregiver or specialists. SEEK MEDICAL CARE IF:   You have a recurrence of nausea, vomiting or abdominal discomfort.  You develop fever of more than 102 F (38.9 C). SEEK IMMEDIATE MEDICAL CARE IF:   You have severe abdominal pain.  You are unable to keep  of nausea, vomiting or abdominal discomfort.   You develop fever of more than 102 F (38.9 C).  SEEK IMMEDIATE MEDICAL CARE IF:    You have severe abdominal pain.   You are unable to keep fluids down.  Document Released: 09/19/2003 Document Revised: 12/09/2011 Document Reviewed: 01/19/2009  [employer] Patient Information 2014 [employer]

## 2014-01-10 NOTE — ED Provider Notes (Signed)
CSN: [id number]     Arrival date & time 01/10/14  1804 History   First MD Initiated Contact with Patient 01/10/14 2011     Chief Complaint  Patient presents with  . Abdominal Pain  . Constipation     (Consider location/radiation/quality/duration/timing/severity/associated sxs/prior Treatment) Patient is a 73 y.o. male presenting with abdominal pain and constipation. The history is provided by the patient.  Abdominal Pain Pain location:  Suprapubic (had a robot assisted radical prostatectomy 12 days ago and since that time has only had 2 small BM;s.  STates only abd pain is in the suprapubic region where all the bruising is but that is getting better) Pain quality: bloating and fullness   Pain radiates to:  Does not radiate Pain severity:  Mild Timing:  Constant Progression:  Improving Chronicity:  New Context: laxative use   Context comment:  Started after his surgery Relieved by:  Nothing Worsened by:  Nothing tried Ineffective treatments:  OTC medications (tried miralax, mag citrate, stool softeners, dulcolax) Associated symptoms: anorexia, belching, constipation and flatus   Associated symptoms: no diarrhea, no dysuria, no nausea and no vomiting   Risk factors comment:  Recent surgery and has been taking hydrocodone for pain Constipation Associated symptoms: abdominal pain, anorexia and flatus   Associated symptoms: no diarrhea, no dysuria, no nausea and no vomiting     Past Medical History  Diagnosis Date  . History of kidney stones     x1 none recent  . Hypertension   . Hyperglycemia 12-23-13    x1 elevated blood sugar- being observed, last check was down  . Cancer 12-23-13    dx. Prostate cancer after 10-07-13 bx.   Past Surgical History  Procedure Laterality Date  . Tonsillectomy    . Appendectomy    . Knee arthroscopy Left     Left knee meniscus  . Vasectomy    . Robot assisted laparoscopic radical prostatectomy N/A 12/30/2013    Procedure: ROBOTIC ASSISTED  LAPAROSCOPIC RADICAL PROSTATECTOMY LEVEL 2;  Surgeon: Dutch Gray, MD;  Location: [facility];  Service: Urology;  Laterality: N/A;  . Lymphadenectomy Bilateral 12/30/2013    Procedure: LYMPHADENECTOMY;  Surgeon: Dutch Gray, MD;  Location: [facility];  Service: Urology;  Laterality: Bilateral;   Family History  Problem Relation Age of Onset  . Cancer - Other Father     Esophageal   History  Substance Use Topics  . Smoking status: Former Research scientist (life sciences)  . Smokeless tobacco: Current User    Types: Chew     Comment: used chewing Tobacco x 45 yrs  . Alcohol Use: No    Review of Systems  Gastrointestinal: Positive for abdominal pain, constipation, anorexia and flatus. Negative for nausea, vomiting and diarrhea.  Genitourinary: Negative for dysuria.  All other systems reviewed and are negative.     Allergies  Review of patient's allergies indicates no known allergies.  Home Medications   Current Outpatient Rx  Name  Route  Sig  Dispense  Refill  . hydrochlorothiazide (HYDRODIURIL) 25 MG tablet   Oral   Take 25 mg by mouth every morning.          Marland Kitchen HYDROcodone-acetaminophen (NORCO) 5-325 MG per tablet   Oral   Take 1-2 tablets by mouth every 6 (six) hours as needed.   30 tablet   0   . ciprofloxacin (CIPRO) 500 MG tablet   Oral   Take 1 tablet (500 mg total) by mouth 2 (two) times daily. Start day prior to office  visit for foley removal   6 tablet   0    BP 132/69  Pulse 87  Temp(Src) 98.6 F (37 C) (Oral)  Resp 16  SpO2 96% Physical Exam  Nursing note and vitals reviewed. Constitutional: He is oriented to person, place, and time. He appears well-developed and well-nourished. No distress.  HENT:  Head: Normocephalic and atraumatic.  Mouth/Throat: Oropharynx is clear and moist.  Eyes: Conjunctivae and EOM are normal. Pupils are equal, round, and reactive to light.  Neck: Normal range of motion. Neck supple.  Cardiovascular: Normal rate, regular rhythm and intact distal pulses.    No murmur heard. Pulmonary/Chest: Effort normal and breath sounds normal. No respiratory distress. He has no wheezes. He has no rales.  Abdominal: Soft. Bowel sounds are normal. He exhibits distension. There is no tenderness. There is no rebound and no guarding.  Healing ecchymosis over the lower abd.  Healing laparoscopic surgical incisions.  No drainage or erythema.  Musculoskeletal: Normal range of motion. He exhibits no edema and no tenderness.  Neurological: He is alert and oriented to person, place, and time.  Skin: Skin is warm and dry. No rash noted. No erythema.  Psychiatric: He has a normal mood and affect. His behavior is normal.    ED Course  Procedures (including critical care time) Labs Review Labs Reviewed  CBC WITH DIFFERENTIAL - Abnormal; Notable for the following:    WBC 15.6 (*)    Platelets 477 (*)    Neutrophils Relative % 85 (*)    Neutro Abs 13.2 (*)    Lymphocytes Relative 8 (*)    Monocytes Absolute 1.1 (*)    All other components within normal limits  COMPREHENSIVE METABOLIC PANEL - Abnormal; Notable for the following:    Sodium 136 (*)    Chloride 94 (*)    Glucose, Bld 130 (*)    GFR calc non Af Amer 65 (*)    GFR calc Af Amer 75 (*)    All other components within normal limits  URINALYSIS, ROUTINE W REFLEX MICROSCOPIC   Imaging Review Dg Abd Acute W/chest  01/10/2014   CLINICAL DATA:  Abdominal pain  EXAM: ACUTE ABDOMEN SERIES (ABDOMEN 2 VIEW & CHEST 1 VIEW)  COMPARISON:  12/23/2013  FINDINGS: Cardiac shadow is stable. Mild interstitial changes are again identified and stable.  Scattered large and small bowel gas is noted. Fecal material is noted throughout the colon. Multiple small bowel air-fluid levels are noted. This may be related to a generalized ileus. Correlation with the clinical exam is recommended. No free air is seen. Degenerative changes of the will thoracolumbar spine are noted.  IMPRESSION: Mildly dilated small bowel with air-fluid  levels. This may represent a generalized ileus. Correlation with physical exam is recommended. Followup imaging is recommended as well.   Electronically Signed   By: Inez Catalina M.D.   On: 01/10/2014 21:10     EKG Interpretation None      MDM   Final diagnoses:  Ileus    Patient presenting after a robotic laparoscopy have a radical prostatectomy 12 days ago difficulty having bowel movements. His last bowel movement was 3 days ago at that time he has passed a small amount of hard stool. He has tried taking Dulcolax, magnesium citrate, MiraLax and stool softeners without improvement. He is passing gas and denies any nausea or vomiting. He has only mild abdominal pain in his lower abdomen were all the ecchymosis from his surgery was. He denies fever and  is urinating without difficulty. He denies passing any bloody discharge through urine or stool.  Pt well appearing and positive bowel sounds with low suspicion of obstruction.  Pt has been taking pain meds and feel that and the surgery may be causing his constipation. CBC with nonspecific leukocytosis of 15,000.  Normal CMP.  Pt denies any urinary sx and denies sensation of fecal impaction.  AAS pending.  9:25 PM AAS shows ileus but pt having no sx of obstruction.  He is tolerating po's and recommended that he continue a liquid diet and give him a little longer for bowels to wake up and continue stools softeners.  Blanchie Dessert, MD 01/10/14 2142

## 2014-01-10 NOTE — ED Notes (Signed)
Patient c/o lower abdominal pain. Patient had prostate surgery 12/30/13. Patient has not had a BM in 3 days. Patient took dulcolax x 2, magnesium citrate, miralax, stool softeners x 3 and still no BM. Patient is passing gas.

## 2014-10-03 DIAGNOSIS — R278 Other lack of coordination: Secondary | ICD-10-CM | POA: Diagnosis not present

## 2014-10-03 DIAGNOSIS — N393 Stress incontinence (female) (male): Secondary | ICD-10-CM | POA: Diagnosis not present

## 2014-10-03 DIAGNOSIS — M6281 Muscle weakness (generalized): Secondary | ICD-10-CM | POA: Diagnosis not present

## 2014-10-19 DIAGNOSIS — N393 Stress incontinence (female) (male): Secondary | ICD-10-CM | POA: Diagnosis not present

## 2014-10-19 DIAGNOSIS — M6281 Muscle weakness (generalized): Secondary | ICD-10-CM | POA: Diagnosis not present

## 2014-10-19 DIAGNOSIS — R278 Other lack of coordination: Secondary | ICD-10-CM | POA: Diagnosis not present

## 2014-11-07 DIAGNOSIS — M6281 Muscle weakness (generalized): Secondary | ICD-10-CM | POA: Diagnosis not present

## 2014-11-07 DIAGNOSIS — R278 Other lack of coordination: Secondary | ICD-10-CM | POA: Diagnosis not present

## 2014-11-07 DIAGNOSIS — N393 Stress incontinence (female) (male): Secondary | ICD-10-CM | POA: Diagnosis not present

## 2014-12-05 DIAGNOSIS — N393 Stress incontinence (female) (male): Secondary | ICD-10-CM | POA: Diagnosis not present

## 2014-12-05 DIAGNOSIS — M6281 Muscle weakness (generalized): Secondary | ICD-10-CM | POA: Diagnosis not present

## 2014-12-05 DIAGNOSIS — R278 Other lack of coordination: Secondary | ICD-10-CM | POA: Diagnosis not present

## 2015-01-02 DIAGNOSIS — R278 Other lack of coordination: Secondary | ICD-10-CM | POA: Diagnosis not present

## 2015-01-02 DIAGNOSIS — N393 Stress incontinence (female) (male): Secondary | ICD-10-CM | POA: Diagnosis not present

## 2015-01-02 DIAGNOSIS — M6281 Muscle weakness (generalized): Secondary | ICD-10-CM | POA: Diagnosis not present

## 2015-01-18 DIAGNOSIS — N529 Male erectile dysfunction, unspecified: Secondary | ICD-10-CM | POA: Diagnosis not present

## 2015-01-18 DIAGNOSIS — C61 Malignant neoplasm of prostate: Secondary | ICD-10-CM | POA: Diagnosis not present

## 2015-01-18 DIAGNOSIS — N393 Stress incontinence (female) (male): Secondary | ICD-10-CM | POA: Diagnosis not present

## 2015-01-24 IMAGING — CR DG ABDOMEN ACUTE W/ 1V CHEST
3 series · 3 of 3 positions shown · non-contrast
Comparison: 12/23/2013

CLINICAL DATA: Abdominal pain

EXAM:
ACUTE ABDOMEN SERIES (ABDOMEN 2 VIEW & CHEST 1 VIEW)

[w chest pa]
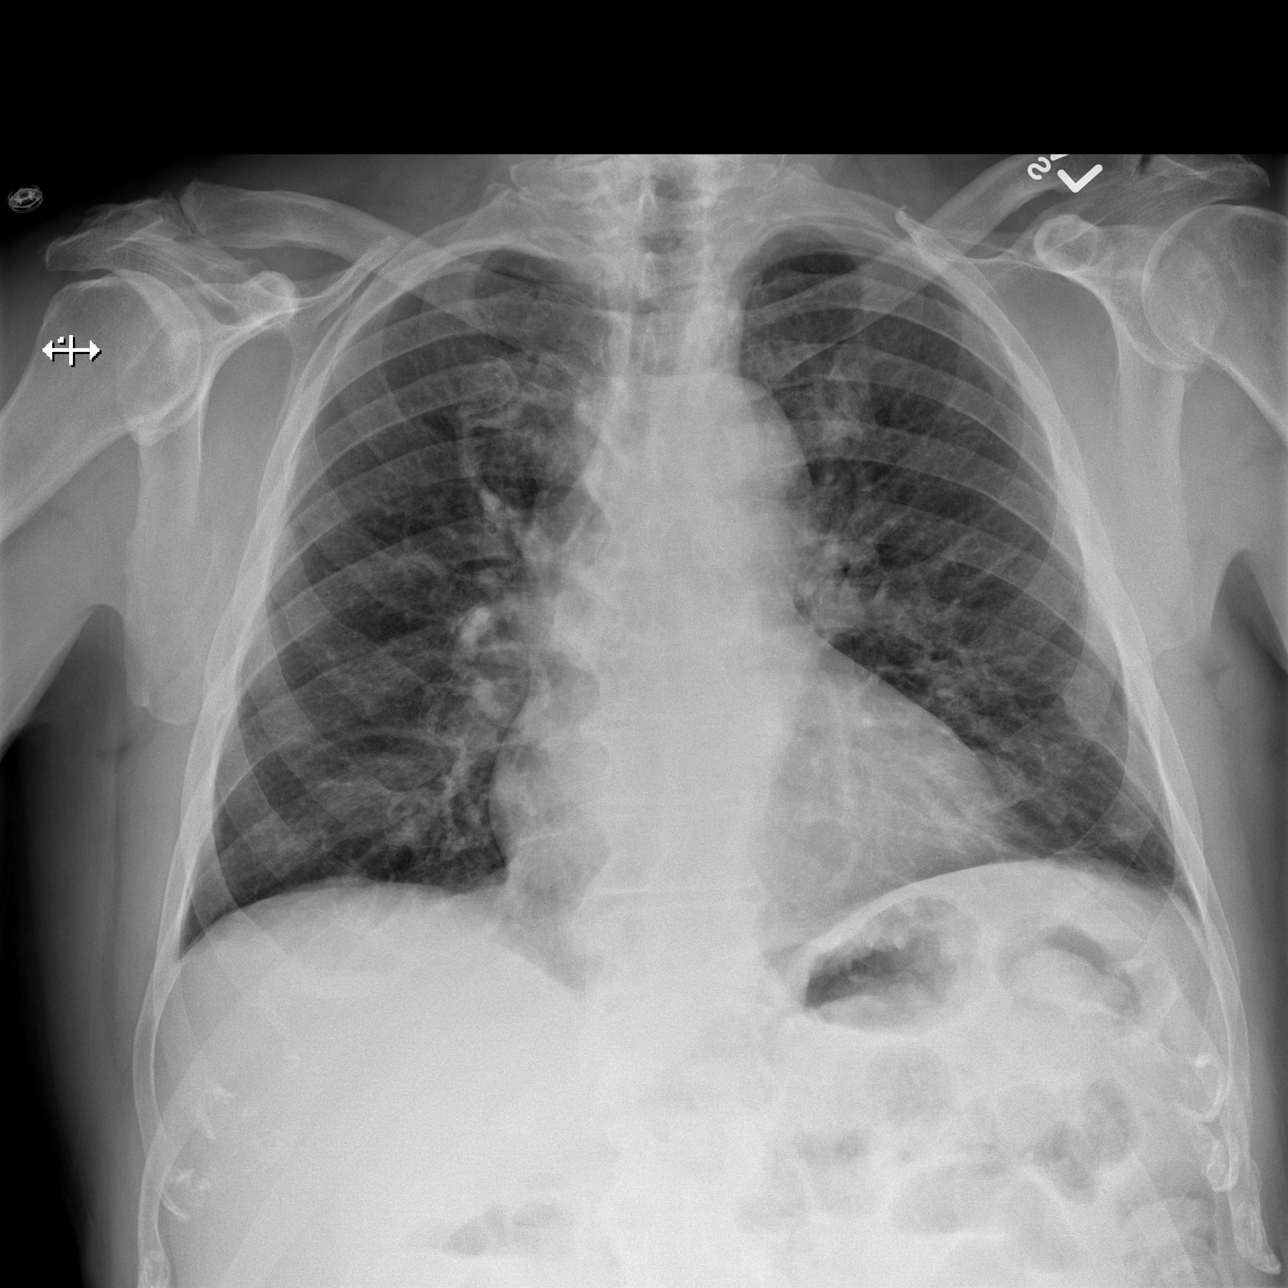

[w abdomen upright]
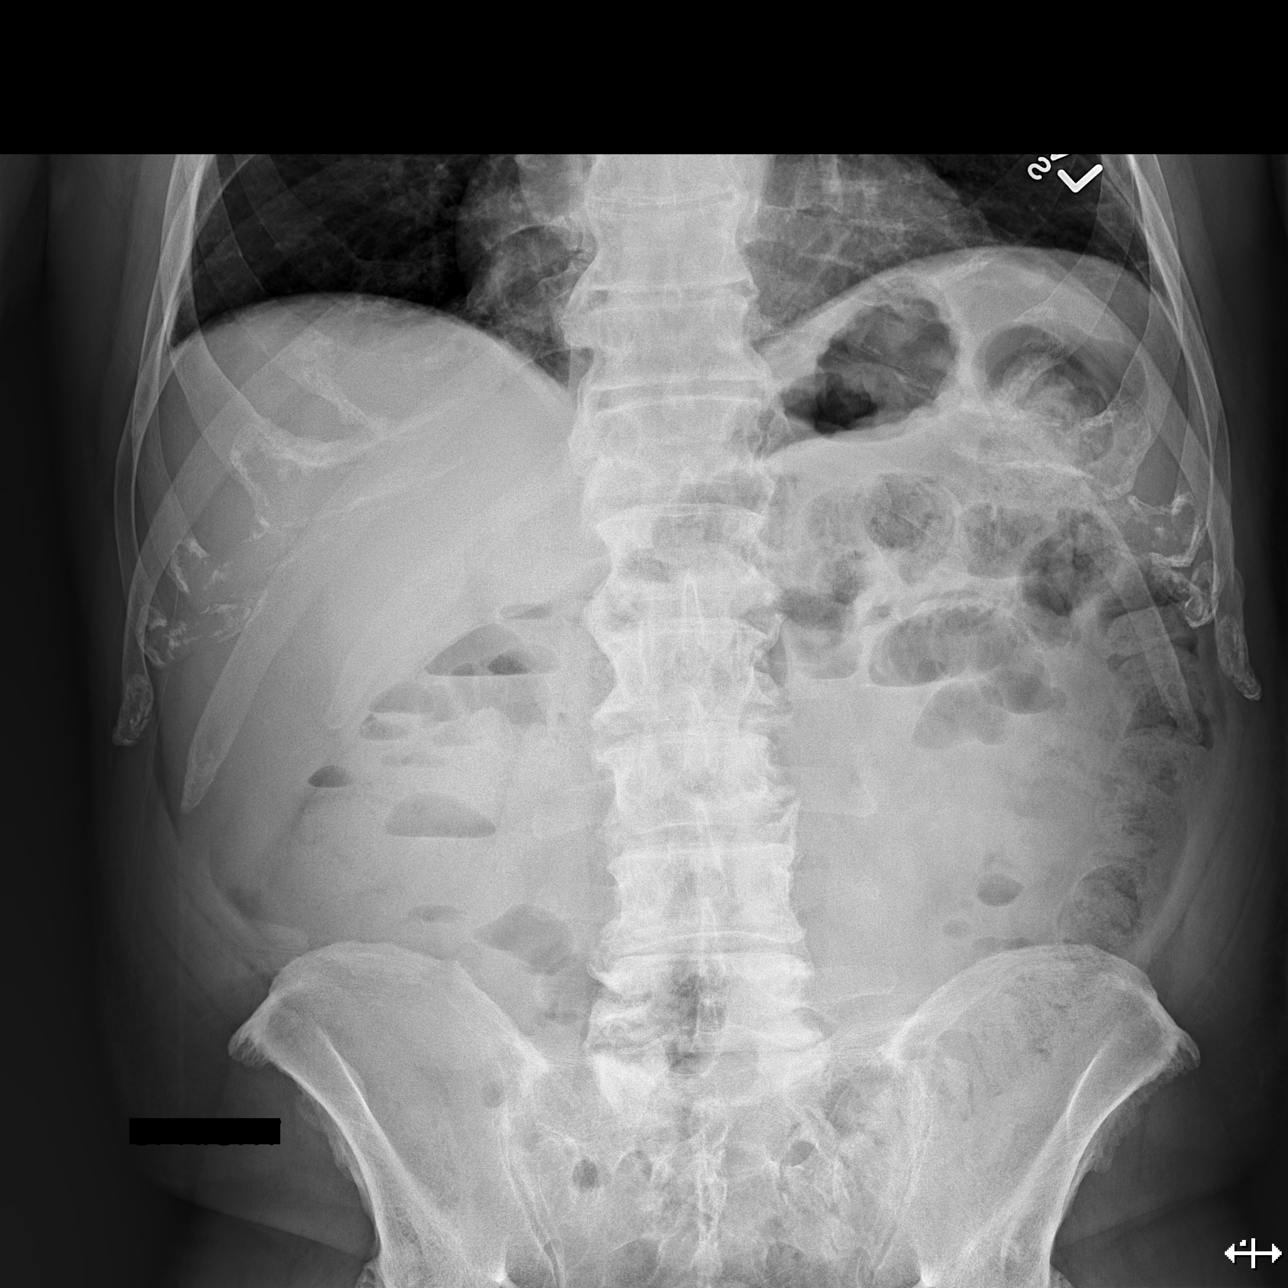

[t abdomen supine]
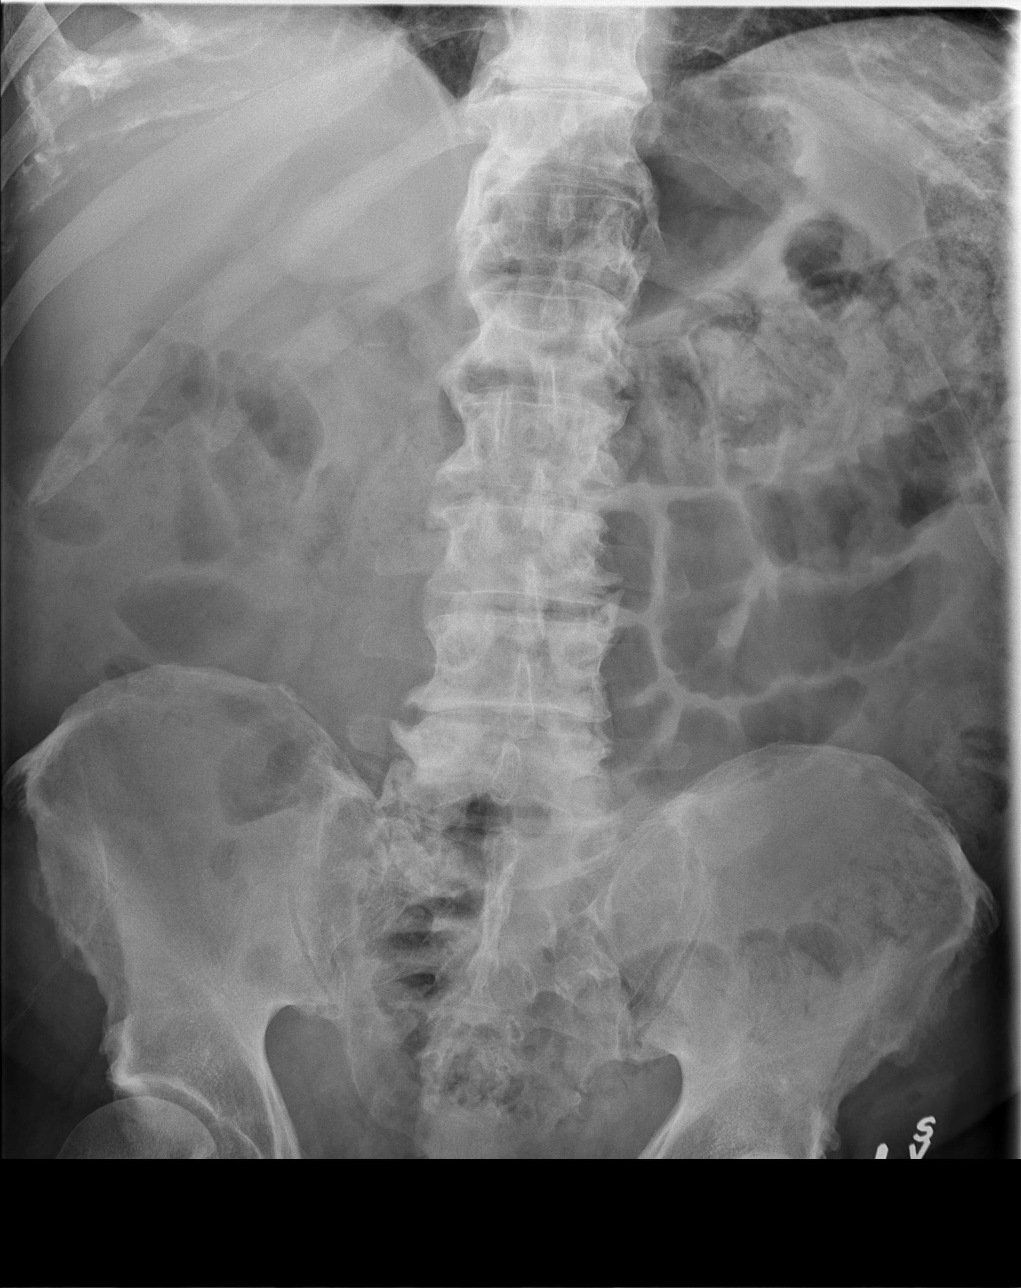

[3 of 3 positions shown; findings below may reference images not displayed]

FINDINGS: Cardiac shadow is stable. Mild interstitial changes are again
identified and stable.

Scattered large and small bowel gas is noted. Fecal material is
noted throughout the colon. Multiple small bowel air-fluid levels
are noted. This may be related to a generalized ileus. Correlation
with the clinical exam is recommended. No free air is seen.
Degenerative changes of the will thoracolumbar spine are noted.
IMPRESSION: Mildly dilated small bowel with air-fluid levels. This may represent
a generalized ileus. Correlation with physical exam is recommended.
Followup imaging is recommended as well.

## 2015-01-30 DIAGNOSIS — N393 Stress incontinence (female) (male): Secondary | ICD-10-CM | POA: Diagnosis not present

## 2015-01-30 DIAGNOSIS — R278 Other lack of coordination: Secondary | ICD-10-CM | POA: Diagnosis not present

## 2015-01-30 DIAGNOSIS — M6281 Muscle weakness (generalized): Secondary | ICD-10-CM | POA: Diagnosis not present

## 2015-03-07 DIAGNOSIS — R32 Unspecified urinary incontinence: Secondary | ICD-10-CM | POA: Diagnosis not present

## 2015-03-07 DIAGNOSIS — L989 Disorder of the skin and subcutaneous tissue, unspecified: Secondary | ICD-10-CM | POA: Diagnosis not present

## 2015-03-07 DIAGNOSIS — Z9181 History of falling: Secondary | ICD-10-CM | POA: Diagnosis not present

## 2015-03-07 DIAGNOSIS — Z1389 Encounter for screening for other disorder: Secondary | ICD-10-CM | POA: Diagnosis not present

## 2015-03-07 DIAGNOSIS — Z6829 Body mass index (BMI) 29.0-29.9, adult: Secondary | ICD-10-CM | POA: Diagnosis not present

## 2015-03-07 DIAGNOSIS — I10 Essential (primary) hypertension: Secondary | ICD-10-CM | POA: Diagnosis not present

## 2015-03-07 DIAGNOSIS — E119 Type 2 diabetes mellitus without complications: Secondary | ICD-10-CM | POA: Diagnosis not present

## 2015-03-08 DIAGNOSIS — R278 Other lack of coordination: Secondary | ICD-10-CM | POA: Diagnosis not present

## 2015-03-08 DIAGNOSIS — N3946 Mixed incontinence: Secondary | ICD-10-CM | POA: Diagnosis not present

## 2015-03-08 DIAGNOSIS — N393 Stress incontinence (female) (male): Secondary | ICD-10-CM | POA: Diagnosis not present

## 2015-03-08 DIAGNOSIS — M6281 Muscle weakness (generalized): Secondary | ICD-10-CM | POA: Diagnosis not present

## 2015-03-08 DIAGNOSIS — R350 Frequency of micturition: Secondary | ICD-10-CM | POA: Diagnosis not present

## 2015-04-06 DIAGNOSIS — H2513 Age-related nuclear cataract, bilateral: Secondary | ICD-10-CM | POA: Diagnosis not present

## 2015-04-06 DIAGNOSIS — H5203 Hypermetropia, bilateral: Secondary | ICD-10-CM | POA: Diagnosis not present

## 2015-04-06 DIAGNOSIS — Z0100 Encounter for examination of eyes and vision without abnormal findings: Secondary | ICD-10-CM | POA: Diagnosis not present

## 2015-04-06 DIAGNOSIS — H524 Presbyopia: Secondary | ICD-10-CM | POA: Diagnosis not present

## 2015-04-06 DIAGNOSIS — H52223 Regular astigmatism, bilateral: Secondary | ICD-10-CM | POA: Diagnosis not present

## 2015-04-06 DIAGNOSIS — H5210 Myopia, unspecified eye: Secondary | ICD-10-CM | POA: Diagnosis not present

## 2015-04-10 DIAGNOSIS — R350 Frequency of micturition: Secondary | ICD-10-CM | POA: Diagnosis not present

## 2015-04-10 DIAGNOSIS — N3946 Mixed incontinence: Secondary | ICD-10-CM | POA: Diagnosis not present

## 2015-05-04 DIAGNOSIS — L570 Actinic keratosis: Secondary | ICD-10-CM | POA: Diagnosis not present

## 2015-05-04 DIAGNOSIS — L219 Seborrheic dermatitis, unspecified: Secondary | ICD-10-CM | POA: Diagnosis not present

## 2015-05-29 ENCOUNTER — Ambulatory Visit
Admission: RE | Admit: 2015-05-29 | Discharge: 2015-05-29 | Disposition: A | Discharge status: Home / Self Care | Payer: Commercial Managed Care - HMO | Source: Ambulatory Visit | Attending: Radiation Oncology | Admitting: Radiation Oncology

## 2015-06-14 DIAGNOSIS — C44311 Basal cell carcinoma of skin of nose: Secondary | ICD-10-CM | POA: Diagnosis not present

## 2015-06-21 DIAGNOSIS — C44311 Basal cell carcinoma of skin of nose: Secondary | ICD-10-CM | POA: Diagnosis not present

## 2015-08-09 DIAGNOSIS — C61 Malignant neoplasm of prostate: Secondary | ICD-10-CM | POA: Diagnosis not present

## 2015-08-09 DIAGNOSIS — N3946 Mixed incontinence: Secondary | ICD-10-CM | POA: Diagnosis not present

## 2015-08-09 DIAGNOSIS — N5201 Erectile dysfunction due to arterial insufficiency: Secondary | ICD-10-CM | POA: Diagnosis not present

## 2016-01-04 DIAGNOSIS — H5203 Hypermetropia, bilateral: Secondary | ICD-10-CM | POA: Diagnosis not present

## 2016-01-04 DIAGNOSIS — H5210 Myopia, unspecified eye: Secondary | ICD-10-CM | POA: Diagnosis not present

## 2016-01-22 DIAGNOSIS — H3530 Unspecified macular degeneration: Secondary | ICD-10-CM | POA: Diagnosis not present

## 2016-01-22 DIAGNOSIS — H04123 Dry eye syndrome of bilateral lacrimal glands: Secondary | ICD-10-CM | POA: Diagnosis not present

## 2016-01-22 DIAGNOSIS — H25813 Combined forms of age-related cataract, bilateral: Secondary | ICD-10-CM | POA: Diagnosis not present

## 2016-01-22 DIAGNOSIS — H353130 Nonexudative age-related macular degeneration, bilateral, stage unspecified: Secondary | ICD-10-CM | POA: Diagnosis not present

## 2016-01-29 DIAGNOSIS — H2512 Age-related nuclear cataract, left eye: Secondary | ICD-10-CM | POA: Diagnosis not present

## 2016-01-29 DIAGNOSIS — H25812 Combined forms of age-related cataract, left eye: Secondary | ICD-10-CM | POA: Diagnosis not present

## 2016-01-30 DIAGNOSIS — H25813 Combined forms of age-related cataract, bilateral: Secondary | ICD-10-CM | POA: Diagnosis not present

## 2016-02-06 DIAGNOSIS — C61 Malignant neoplasm of prostate: Secondary | ICD-10-CM | POA: Diagnosis not present

## 2016-02-14 DIAGNOSIS — Z0000 Encounter for general adult medical examination without abnormal findings: Secondary | ICD-10-CM | POA: Diagnosis not present

## 2016-02-14 DIAGNOSIS — N5201 Erectile dysfunction due to arterial insufficiency: Secondary | ICD-10-CM | POA: Diagnosis not present

## 2016-02-14 DIAGNOSIS — N3946 Mixed incontinence: Secondary | ICD-10-CM | POA: Diagnosis not present

## 2016-02-14 DIAGNOSIS — C61 Malignant neoplasm of prostate: Secondary | ICD-10-CM | POA: Diagnosis not present

## 2016-03-25 DIAGNOSIS — H2511 Age-related nuclear cataract, right eye: Secondary | ICD-10-CM | POA: Diagnosis not present

## 2016-03-25 DIAGNOSIS — H25811 Combined forms of age-related cataract, right eye: Secondary | ICD-10-CM | POA: Diagnosis not present

## 2016-03-25 DIAGNOSIS — H2513 Age-related nuclear cataract, bilateral: Secondary | ICD-10-CM | POA: Diagnosis not present

## 2016-07-15 DIAGNOSIS — Z9181 History of falling: Secondary | ICD-10-CM | POA: Diagnosis not present

## 2016-07-15 DIAGNOSIS — Z6829 Body mass index (BMI) 29.0-29.9, adult: Secondary | ICD-10-CM | POA: Diagnosis not present

## 2016-07-15 DIAGNOSIS — I10 Essential (primary) hypertension: Secondary | ICD-10-CM | POA: Diagnosis not present

## 2016-07-15 DIAGNOSIS — Z0000 Encounter for general adult medical examination without abnormal findings: Secondary | ICD-10-CM | POA: Diagnosis not present

## 2016-07-15 DIAGNOSIS — Z23 Encounter for immunization: Secondary | ICD-10-CM | POA: Diagnosis not present

## 2016-07-15 DIAGNOSIS — E663 Overweight: Secondary | ICD-10-CM | POA: Diagnosis not present

## 2016-07-15 DIAGNOSIS — Z1389 Encounter for screening for other disorder: Secondary | ICD-10-CM | POA: Diagnosis not present

## 2016-10-18 DIAGNOSIS — C61 Malignant neoplasm of prostate: Secondary | ICD-10-CM | POA: Diagnosis not present

## 2016-10-25 DIAGNOSIS — N3946 Mixed incontinence: Secondary | ICD-10-CM | POA: Diagnosis not present

## 2016-10-25 DIAGNOSIS — Z8546 Personal history of malignant neoplasm of prostate: Secondary | ICD-10-CM | POA: Diagnosis not present

## 2016-11-03 DIAGNOSIS — J111 Influenza due to unidentified influenza virus with other respiratory manifestations: Secondary | ICD-10-CM | POA: Diagnosis not present

## 2016-11-22 DIAGNOSIS — L218 Other seborrheic dermatitis: Secondary | ICD-10-CM | POA: Diagnosis not present

## 2016-11-22 DIAGNOSIS — L814 Other melanin hyperpigmentation: Secondary | ICD-10-CM | POA: Diagnosis not present

## 2016-11-22 DIAGNOSIS — Z85828 Personal history of other malignant neoplasm of skin: Secondary | ICD-10-CM | POA: Diagnosis not present

## 2016-11-22 DIAGNOSIS — D225 Melanocytic nevi of trunk: Secondary | ICD-10-CM | POA: Diagnosis not present

## 2016-11-22 DIAGNOSIS — L821 Other seborrheic keratosis: Secondary | ICD-10-CM | POA: Diagnosis not present

## 2016-11-22 DIAGNOSIS — L570 Actinic keratosis: Secondary | ICD-10-CM | POA: Diagnosis not present

## 2016-11-22 DIAGNOSIS — D485 Neoplasm of uncertain behavior of skin: Secondary | ICD-10-CM | POA: Diagnosis not present

## 2016-11-22 DIAGNOSIS — D1801 Hemangioma of skin and subcutaneous tissue: Secondary | ICD-10-CM | POA: Diagnosis not present

## 2017-01-07 DIAGNOSIS — H5202 Hypermetropia, left eye: Secondary | ICD-10-CM | POA: Diagnosis not present

## 2017-05-12 DIAGNOSIS — Z8546 Personal history of malignant neoplasm of prostate: Secondary | ICD-10-CM | POA: Diagnosis not present

## 2017-05-13 DIAGNOSIS — E119 Type 2 diabetes mellitus without complications: Secondary | ICD-10-CM | POA: Diagnosis not present

## 2017-05-13 DIAGNOSIS — I10 Essential (primary) hypertension: Secondary | ICD-10-CM | POA: Diagnosis not present

## 2017-05-13 DIAGNOSIS — E785 Hyperlipidemia, unspecified: Secondary | ICD-10-CM | POA: Diagnosis not present

## 2017-05-13 DIAGNOSIS — C61 Malignant neoplasm of prostate: Secondary | ICD-10-CM | POA: Diagnosis not present

## 2017-05-23 DIAGNOSIS — N3946 Mixed incontinence: Secondary | ICD-10-CM | POA: Diagnosis not present

## 2017-05-23 DIAGNOSIS — Z8546 Personal history of malignant neoplasm of prostate: Secondary | ICD-10-CM | POA: Diagnosis not present

## 2017-08-14 DIAGNOSIS — Z1331 Encounter for screening for depression: Secondary | ICD-10-CM | POA: Diagnosis not present

## 2017-08-14 DIAGNOSIS — Z23 Encounter for immunization: Secondary | ICD-10-CM | POA: Diagnosis not present

## 2017-08-14 DIAGNOSIS — E119 Type 2 diabetes mellitus without complications: Secondary | ICD-10-CM | POA: Diagnosis not present

## 2017-08-14 DIAGNOSIS — Z1211 Encounter for screening for malignant neoplasm of colon: Secondary | ICD-10-CM | POA: Diagnosis not present

## 2017-08-14 DIAGNOSIS — I10 Essential (primary) hypertension: Secondary | ICD-10-CM | POA: Diagnosis not present

## 2017-08-14 DIAGNOSIS — Z0000 Encounter for general adult medical examination without abnormal findings: Secondary | ICD-10-CM | POA: Diagnosis not present

## 2017-08-14 DIAGNOSIS — Z1212 Encounter for screening for malignant neoplasm of rectum: Secondary | ICD-10-CM | POA: Diagnosis not present

## 2017-08-14 DIAGNOSIS — Z125 Encounter for screening for malignant neoplasm of prostate: Secondary | ICD-10-CM | POA: Diagnosis not present

## 2017-08-14 DIAGNOSIS — E785 Hyperlipidemia, unspecified: Secondary | ICD-10-CM | POA: Diagnosis not present

## 2017-08-14 DIAGNOSIS — E663 Overweight: Secondary | ICD-10-CM | POA: Diagnosis not present

## 2017-08-28 DIAGNOSIS — E119 Type 2 diabetes mellitus without complications: Secondary | ICD-10-CM | POA: Diagnosis not present

## 2017-08-28 DIAGNOSIS — N183 Chronic kidney disease, stage 3 (moderate): Secondary | ICD-10-CM | POA: Diagnosis not present

## 2017-08-28 DIAGNOSIS — I129 Hypertensive chronic kidney disease with stage 1 through stage 4 chronic kidney disease, or unspecified chronic kidney disease: Secondary | ICD-10-CM | POA: Diagnosis not present

## 2017-08-28 DIAGNOSIS — E785 Hyperlipidemia, unspecified: Secondary | ICD-10-CM | POA: Diagnosis not present

## 2017-08-28 DIAGNOSIS — M1712 Unilateral primary osteoarthritis, left knee: Secondary | ICD-10-CM | POA: Diagnosis not present

## 2017-08-28 DIAGNOSIS — H9190 Unspecified hearing loss, unspecified ear: Secondary | ICD-10-CM | POA: Diagnosis not present

## 2017-12-10 DIAGNOSIS — M1712 Unilateral primary osteoarthritis, left knee: Secondary | ICD-10-CM | POA: Diagnosis not present

## 2017-12-10 DIAGNOSIS — E785 Hyperlipidemia, unspecified: Secondary | ICD-10-CM | POA: Diagnosis not present

## 2017-12-10 DIAGNOSIS — N183 Chronic kidney disease, stage 3 (moderate): Secondary | ICD-10-CM | POA: Diagnosis not present

## 2017-12-10 DIAGNOSIS — I129 Hypertensive chronic kidney disease with stage 1 through stage 4 chronic kidney disease, or unspecified chronic kidney disease: Secondary | ICD-10-CM | POA: Diagnosis not present

## 2017-12-10 DIAGNOSIS — E119 Type 2 diabetes mellitus without complications: Secondary | ICD-10-CM | POA: Diagnosis not present

## 2017-12-15 DIAGNOSIS — Z8546 Personal history of malignant neoplasm of prostate: Secondary | ICD-10-CM | POA: Diagnosis not present

## 2017-12-19 DIAGNOSIS — Z8546 Personal history of malignant neoplasm of prostate: Secondary | ICD-10-CM | POA: Diagnosis not present

## 2017-12-19 DIAGNOSIS — N3946 Mixed incontinence: Secondary | ICD-10-CM | POA: Diagnosis not present

## 2018-01-09 DIAGNOSIS — L918 Other hypertrophic disorders of the skin: Secondary | ICD-10-CM | POA: Diagnosis not present

## 2018-01-09 DIAGNOSIS — Z85828 Personal history of other malignant neoplasm of skin: Secondary | ICD-10-CM | POA: Diagnosis not present

## 2018-01-09 DIAGNOSIS — D1801 Hemangioma of skin and subcutaneous tissue: Secondary | ICD-10-CM | POA: Diagnosis not present

## 2018-01-09 DIAGNOSIS — D225 Melanocytic nevi of trunk: Secondary | ICD-10-CM | POA: Diagnosis not present

## 2018-01-09 DIAGNOSIS — L821 Other seborrheic keratosis: Secondary | ICD-10-CM | POA: Diagnosis not present

## 2018-01-09 DIAGNOSIS — L570 Actinic keratosis: Secondary | ICD-10-CM | POA: Diagnosis not present

## 2018-01-09 DIAGNOSIS — L814 Other melanin hyperpigmentation: Secondary | ICD-10-CM | POA: Diagnosis not present

## 2018-04-13 DIAGNOSIS — Z1339 Encounter for screening examination for other mental health and behavioral disorders: Secondary | ICD-10-CM | POA: Diagnosis not present

## 2018-04-13 DIAGNOSIS — I129 Hypertensive chronic kidney disease with stage 1 through stage 4 chronic kidney disease, or unspecified chronic kidney disease: Secondary | ICD-10-CM | POA: Diagnosis not present

## 2018-04-13 DIAGNOSIS — E119 Type 2 diabetes mellitus without complications: Secondary | ICD-10-CM | POA: Diagnosis not present

## 2018-04-13 DIAGNOSIS — E785 Hyperlipidemia, unspecified: Secondary | ICD-10-CM | POA: Diagnosis not present

## 2018-04-13 DIAGNOSIS — Z1211 Encounter for screening for malignant neoplasm of colon: Secondary | ICD-10-CM | POA: Diagnosis not present

## 2018-04-13 DIAGNOSIS — M1712 Unilateral primary osteoarthritis, left knee: Secondary | ICD-10-CM | POA: Diagnosis not present

## 2018-04-21 DIAGNOSIS — H52223 Regular astigmatism, bilateral: Secondary | ICD-10-CM | POA: Diagnosis not present

## 2018-05-20 DIAGNOSIS — S0101XA Laceration without foreign body of scalp, initial encounter: Secondary | ICD-10-CM | POA: Diagnosis not present

## 2018-05-20 DIAGNOSIS — Z23 Encounter for immunization: Secondary | ICD-10-CM | POA: Diagnosis not present

## 2018-05-20 DIAGNOSIS — S0990XA Unspecified injury of head, initial encounter: Secondary | ICD-10-CM | POA: Diagnosis not present

## 2018-06-03 DIAGNOSIS — E785 Hyperlipidemia, unspecified: Secondary | ICD-10-CM | POA: Diagnosis not present

## 2018-06-03 DIAGNOSIS — Z0000 Encounter for general adult medical examination without abnormal findings: Secondary | ICD-10-CM | POA: Diagnosis not present

## 2018-06-03 DIAGNOSIS — Z9181 History of falling: Secondary | ICD-10-CM | POA: Diagnosis not present

## 2018-06-03 DIAGNOSIS — Z1331 Encounter for screening for depression: Secondary | ICD-10-CM | POA: Diagnosis not present

## 2018-06-03 DIAGNOSIS — Z125 Encounter for screening for malignant neoplasm of prostate: Secondary | ICD-10-CM | POA: Diagnosis not present

## 2018-07-08 DIAGNOSIS — Z1211 Encounter for screening for malignant neoplasm of colon: Secondary | ICD-10-CM | POA: Diagnosis not present

## 2018-07-08 DIAGNOSIS — Z86010 Personal history of colonic polyps: Secondary | ICD-10-CM | POA: Diagnosis not present

## 2018-07-21 DIAGNOSIS — Z8546 Personal history of malignant neoplasm of prostate: Secondary | ICD-10-CM | POA: Diagnosis not present

## 2018-07-29 DIAGNOSIS — N5201 Erectile dysfunction due to arterial insufficiency: Secondary | ICD-10-CM | POA: Diagnosis not present

## 2018-07-29 DIAGNOSIS — N3946 Mixed incontinence: Secondary | ICD-10-CM | POA: Diagnosis not present

## 2018-07-29 DIAGNOSIS — Z8546 Personal history of malignant neoplasm of prostate: Secondary | ICD-10-CM | POA: Diagnosis not present

## 2018-07-30 DIAGNOSIS — I10 Essential (primary) hypertension: Secondary | ICD-10-CM | POA: Diagnosis not present

## 2018-07-30 DIAGNOSIS — E663 Overweight: Secondary | ICD-10-CM | POA: Diagnosis not present

## 2018-07-30 DIAGNOSIS — Z23 Encounter for immunization: Secondary | ICD-10-CM | POA: Diagnosis not present

## 2018-07-30 DIAGNOSIS — E119 Type 2 diabetes mellitus without complications: Secondary | ICD-10-CM | POA: Diagnosis not present

## 2018-07-30 DIAGNOSIS — E785 Hyperlipidemia, unspecified: Secondary | ICD-10-CM | POA: Diagnosis not present

## 2018-07-30 DIAGNOSIS — Z6828 Body mass index (BMI) 28.0-28.9, adult: Secondary | ICD-10-CM | POA: Diagnosis not present

## 2018-10-02 DIAGNOSIS — H903 Sensorineural hearing loss, bilateral: Secondary | ICD-10-CM | POA: Diagnosis not present

## 2018-12-15 DIAGNOSIS — E785 Hyperlipidemia, unspecified: Secondary | ICD-10-CM | POA: Diagnosis not present

## 2018-12-15 DIAGNOSIS — E663 Overweight: Secondary | ICD-10-CM | POA: Diagnosis not present

## 2018-12-15 DIAGNOSIS — Z6829 Body mass index (BMI) 29.0-29.9, adult: Secondary | ICD-10-CM | POA: Diagnosis not present

## 2018-12-15 DIAGNOSIS — I10 Essential (primary) hypertension: Secondary | ICD-10-CM | POA: Diagnosis not present

## 2018-12-15 DIAGNOSIS — E119 Type 2 diabetes mellitus without complications: Secondary | ICD-10-CM | POA: Diagnosis not present

## 2018-12-15 DIAGNOSIS — M25512 Pain in left shoulder: Secondary | ICD-10-CM | POA: Diagnosis not present

## 2019-01-20 DIAGNOSIS — Z8546 Personal history of malignant neoplasm of prostate: Secondary | ICD-10-CM | POA: Diagnosis not present

## 2019-01-29 DIAGNOSIS — C61 Malignant neoplasm of prostate: Secondary | ICD-10-CM | POA: Diagnosis not present

## 2019-01-29 DIAGNOSIS — N3946 Mixed incontinence: Secondary | ICD-10-CM | POA: Diagnosis not present

## 2019-03-03 DIAGNOSIS — L821 Other seborrheic keratosis: Secondary | ICD-10-CM | POA: Diagnosis not present

## 2019-03-03 DIAGNOSIS — D2262 Melanocytic nevi of left upper limb, including shoulder: Secondary | ICD-10-CM | POA: Diagnosis not present

## 2019-03-03 DIAGNOSIS — D224 Melanocytic nevi of scalp and neck: Secondary | ICD-10-CM | POA: Diagnosis not present

## 2019-03-03 DIAGNOSIS — L570 Actinic keratosis: Secondary | ICD-10-CM | POA: Diagnosis not present

## 2019-03-03 DIAGNOSIS — B351 Tinea unguium: Secondary | ICD-10-CM | POA: Diagnosis not present

## 2019-03-03 DIAGNOSIS — L718 Other rosacea: Secondary | ICD-10-CM | POA: Diagnosis not present

## 2019-03-03 DIAGNOSIS — L820 Inflamed seborrheic keratosis: Secondary | ICD-10-CM | POA: Diagnosis not present

## 2019-03-03 DIAGNOSIS — L814 Other melanin hyperpigmentation: Secondary | ICD-10-CM | POA: Diagnosis not present

## 2019-03-03 DIAGNOSIS — D692 Other nonthrombocytopenic purpura: Secondary | ICD-10-CM | POA: Diagnosis not present

## 2019-04-22 DIAGNOSIS — Z139 Encounter for screening, unspecified: Secondary | ICD-10-CM | POA: Diagnosis not present

## 2019-04-22 DIAGNOSIS — Z6829 Body mass index (BMI) 29.0-29.9, adult: Secondary | ICD-10-CM | POA: Diagnosis not present

## 2019-04-22 DIAGNOSIS — I10 Essential (primary) hypertension: Secondary | ICD-10-CM | POA: Diagnosis not present

## 2019-04-22 DIAGNOSIS — E785 Hyperlipidemia, unspecified: Secondary | ICD-10-CM | POA: Diagnosis not present

## 2019-04-22 DIAGNOSIS — E119 Type 2 diabetes mellitus without complications: Secondary | ICD-10-CM | POA: Diagnosis not present

## 2019-05-06 DIAGNOSIS — E119 Type 2 diabetes mellitus without complications: Secondary | ICD-10-CM | POA: Diagnosis not present

## 2019-05-06 DIAGNOSIS — I10 Essential (primary) hypertension: Secondary | ICD-10-CM | POA: Diagnosis not present

## 2019-05-06 DIAGNOSIS — E785 Hyperlipidemia, unspecified: Secondary | ICD-10-CM | POA: Diagnosis not present

## 2019-06-08 DIAGNOSIS — H04123 Dry eye syndrome of bilateral lacrimal glands: Secondary | ICD-10-CM | POA: Diagnosis not present

## 2019-06-09 DIAGNOSIS — E785 Hyperlipidemia, unspecified: Secondary | ICD-10-CM | POA: Diagnosis not present

## 2019-06-09 DIAGNOSIS — Z0000 Encounter for general adult medical examination without abnormal findings: Secondary | ICD-10-CM | POA: Diagnosis not present

## 2019-06-09 DIAGNOSIS — Z9181 History of falling: Secondary | ICD-10-CM | POA: Diagnosis not present

## 2019-06-09 DIAGNOSIS — Z125 Encounter for screening for malignant neoplasm of prostate: Secondary | ICD-10-CM | POA: Diagnosis not present

## 2019-06-09 DIAGNOSIS — Z1331 Encounter for screening for depression: Secondary | ICD-10-CM | POA: Diagnosis not present

## 2019-06-09 DIAGNOSIS — Z136 Encounter for screening for cardiovascular disorders: Secondary | ICD-10-CM | POA: Diagnosis not present

## 2019-08-17 DIAGNOSIS — Z23 Encounter for immunization: Secondary | ICD-10-CM | POA: Diagnosis not present

## 2019-10-26 DIAGNOSIS — Z6829 Body mass index (BMI) 29.0-29.9, adult: Secondary | ICD-10-CM | POA: Diagnosis not present

## 2019-10-26 DIAGNOSIS — E785 Hyperlipidemia, unspecified: Secondary | ICD-10-CM | POA: Diagnosis not present

## 2019-10-26 DIAGNOSIS — I10 Essential (primary) hypertension: Secondary | ICD-10-CM | POA: Diagnosis not present

## 2019-10-26 DIAGNOSIS — Z2821 Immunization not carried out because of patient refusal: Secondary | ICD-10-CM | POA: Diagnosis not present

## 2019-10-26 DIAGNOSIS — Z8546 Personal history of malignant neoplasm of prostate: Secondary | ICD-10-CM | POA: Diagnosis not present

## 2019-10-26 DIAGNOSIS — H6120 Impacted cerumen, unspecified ear: Secondary | ICD-10-CM | POA: Diagnosis not present

## 2019-10-26 DIAGNOSIS — E119 Type 2 diabetes mellitus without complications: Secondary | ICD-10-CM | POA: Diagnosis not present

## 2020-01-28 DIAGNOSIS — C61 Malignant neoplasm of prostate: Secondary | ICD-10-CM | POA: Diagnosis not present

## 2020-02-04 DIAGNOSIS — Z8546 Personal history of malignant neoplasm of prostate: Secondary | ICD-10-CM | POA: Diagnosis not present

## 2020-02-04 DIAGNOSIS — N3946 Mixed incontinence: Secondary | ICD-10-CM | POA: Diagnosis not present

## 2020-02-25 DIAGNOSIS — M25561 Pain in right knee: Secondary | ICD-10-CM | POA: Diagnosis not present

## 2020-02-25 DIAGNOSIS — E785 Hyperlipidemia, unspecified: Secondary | ICD-10-CM | POA: Diagnosis not present

## 2020-02-25 DIAGNOSIS — E119 Type 2 diabetes mellitus without complications: Secondary | ICD-10-CM | POA: Diagnosis not present

## 2020-02-25 DIAGNOSIS — Z2821 Immunization not carried out because of patient refusal: Secondary | ICD-10-CM | POA: Diagnosis not present

## 2020-02-25 DIAGNOSIS — Z8546 Personal history of malignant neoplasm of prostate: Secondary | ICD-10-CM | POA: Diagnosis not present

## 2020-02-25 DIAGNOSIS — I10 Essential (primary) hypertension: Secondary | ICD-10-CM | POA: Diagnosis not present

## 2020-02-25 DIAGNOSIS — Z6828 Body mass index (BMI) 28.0-28.9, adult: Secondary | ICD-10-CM | POA: Diagnosis not present

## 2020-04-04 DIAGNOSIS — I10 Essential (primary) hypertension: Secondary | ICD-10-CM | POA: Diagnosis not present

## 2020-04-05 DIAGNOSIS — M1711 Unilateral primary osteoarthritis, right knee: Secondary | ICD-10-CM | POA: Diagnosis not present

## 2020-04-05 DIAGNOSIS — M25561 Pain in right knee: Secondary | ICD-10-CM | POA: Diagnosis not present

## 2020-04-20 DIAGNOSIS — D2262 Melanocytic nevi of left upper limb, including shoulder: Secondary | ICD-10-CM | POA: Diagnosis not present

## 2020-04-20 DIAGNOSIS — D225 Melanocytic nevi of trunk: Secondary | ICD-10-CM | POA: Diagnosis not present

## 2020-04-20 DIAGNOSIS — L814 Other melanin hyperpigmentation: Secondary | ICD-10-CM | POA: Diagnosis not present

## 2020-04-20 DIAGNOSIS — L821 Other seborrheic keratosis: Secondary | ICD-10-CM | POA: Diagnosis not present

## 2020-04-20 DIAGNOSIS — D1801 Hemangioma of skin and subcutaneous tissue: Secondary | ICD-10-CM | POA: Diagnosis not present

## 2020-04-20 DIAGNOSIS — L570 Actinic keratosis: Secondary | ICD-10-CM | POA: Diagnosis not present

## 2020-04-20 DIAGNOSIS — D485 Neoplasm of uncertain behavior of skin: Secondary | ICD-10-CM | POA: Diagnosis not present

## 2020-04-20 DIAGNOSIS — B351 Tinea unguium: Secondary | ICD-10-CM | POA: Diagnosis not present

## 2020-04-20 DIAGNOSIS — L820 Inflamed seborrheic keratosis: Secondary | ICD-10-CM | POA: Diagnosis not present

## 2020-04-20 DIAGNOSIS — L218 Other seborrheic dermatitis: Secondary | ICD-10-CM | POA: Diagnosis not present

## 2020-05-22 DIAGNOSIS — M25561 Pain in right knee: Secondary | ICD-10-CM | POA: Diagnosis not present

## 2020-05-22 DIAGNOSIS — M1711 Unilateral primary osteoarthritis, right knee: Secondary | ICD-10-CM | POA: Diagnosis not present

## 2020-07-03 DIAGNOSIS — Z2821 Immunization not carried out because of patient refusal: Secondary | ICD-10-CM | POA: Diagnosis not present

## 2020-07-03 DIAGNOSIS — M25561 Pain in right knee: Secondary | ICD-10-CM | POA: Diagnosis not present

## 2020-07-03 DIAGNOSIS — Z8546 Personal history of malignant neoplasm of prostate: Secondary | ICD-10-CM | POA: Diagnosis not present

## 2020-07-03 DIAGNOSIS — Z23 Encounter for immunization: Secondary | ICD-10-CM | POA: Diagnosis not present

## 2020-07-03 DIAGNOSIS — E785 Hyperlipidemia, unspecified: Secondary | ICD-10-CM | POA: Diagnosis not present

## 2020-07-03 DIAGNOSIS — Z1331 Encounter for screening for depression: Secondary | ICD-10-CM | POA: Diagnosis not present

## 2020-07-03 DIAGNOSIS — Z9181 History of falling: Secondary | ICD-10-CM | POA: Diagnosis not present

## 2020-07-03 DIAGNOSIS — E119 Type 2 diabetes mellitus without complications: Secondary | ICD-10-CM | POA: Diagnosis not present

## 2020-07-03 DIAGNOSIS — I10 Essential (primary) hypertension: Secondary | ICD-10-CM | POA: Diagnosis not present

## 2020-07-17 DIAGNOSIS — K921 Melena: Secondary | ICD-10-CM | POA: Diagnosis not present

## 2020-07-17 DIAGNOSIS — Z8546 Personal history of malignant neoplasm of prostate: Secondary | ICD-10-CM | POA: Diagnosis not present

## 2020-07-18 DIAGNOSIS — K921 Melena: Secondary | ICD-10-CM | POA: Diagnosis not present

## 2020-07-24 DIAGNOSIS — M1711 Unilateral primary osteoarthritis, right knee: Secondary | ICD-10-CM | POA: Diagnosis not present

## 2020-07-24 DIAGNOSIS — M25561 Pain in right knee: Secondary | ICD-10-CM | POA: Diagnosis not present

## 2020-07-31 DIAGNOSIS — K921 Melena: Secondary | ICD-10-CM | POA: Diagnosis not present

## 2020-08-01 DIAGNOSIS — K5731 Diverticulosis of large intestine without perforation or abscess with bleeding: Secondary | ICD-10-CM | POA: Diagnosis not present

## 2020-08-14 DIAGNOSIS — H02889 Meibomian gland dysfunction of unspecified eye, unspecified eyelid: Secondary | ICD-10-CM | POA: Diagnosis not present

## 2020-08-14 DIAGNOSIS — Z961 Presence of intraocular lens: Secondary | ICD-10-CM | POA: Diagnosis not present

## 2020-08-14 DIAGNOSIS — H52223 Regular astigmatism, bilateral: Secondary | ICD-10-CM | POA: Diagnosis not present

## 2020-08-14 DIAGNOSIS — H5203 Hypermetropia, bilateral: Secondary | ICD-10-CM | POA: Diagnosis not present

## 2020-08-30 DIAGNOSIS — K5731 Diverticulosis of large intestine without perforation or abscess with bleeding: Secondary | ICD-10-CM | POA: Diagnosis not present

## 2020-10-10 DIAGNOSIS — Z0000 Encounter for general adult medical examination without abnormal findings: Secondary | ICD-10-CM | POA: Diagnosis not present

## 2020-10-10 DIAGNOSIS — E785 Hyperlipidemia, unspecified: Secondary | ICD-10-CM | POA: Diagnosis not present

## 2020-10-10 DIAGNOSIS — Z1331 Encounter for screening for depression: Secondary | ICD-10-CM | POA: Diagnosis not present

## 2020-10-10 DIAGNOSIS — Z9181 History of falling: Secondary | ICD-10-CM | POA: Diagnosis not present

## 2020-10-10 DIAGNOSIS — Z139 Encounter for screening, unspecified: Secondary | ICD-10-CM | POA: Diagnosis not present

## 2020-11-08 DIAGNOSIS — E119 Type 2 diabetes mellitus without complications: Secondary | ICD-10-CM | POA: Diagnosis not present

## 2020-11-08 DIAGNOSIS — Z6829 Body mass index (BMI) 29.0-29.9, adult: Secondary | ICD-10-CM | POA: Diagnosis not present

## 2020-11-08 DIAGNOSIS — I10 Essential (primary) hypertension: Secondary | ICD-10-CM | POA: Diagnosis not present

## 2020-11-08 DIAGNOSIS — Z8719 Personal history of other diseases of the digestive system: Secondary | ICD-10-CM | POA: Diagnosis not present

## 2020-11-08 DIAGNOSIS — E785 Hyperlipidemia, unspecified: Secondary | ICD-10-CM | POA: Diagnosis not present

## 2020-11-08 DIAGNOSIS — Z8546 Personal history of malignant neoplasm of prostate: Secondary | ICD-10-CM | POA: Diagnosis not present

## 2021-01-31 DIAGNOSIS — Z8546 Personal history of malignant neoplasm of prostate: Secondary | ICD-10-CM | POA: Diagnosis not present

## 2021-02-07 DIAGNOSIS — N3946 Mixed incontinence: Secondary | ICD-10-CM | POA: Diagnosis not present

## 2021-02-07 DIAGNOSIS — Z8546 Personal history of malignant neoplasm of prostate: Secondary | ICD-10-CM | POA: Diagnosis not present

## 2021-03-22 DIAGNOSIS — E119 Type 2 diabetes mellitus without complications: Secondary | ICD-10-CM | POA: Diagnosis not present

## 2021-03-22 DIAGNOSIS — I10 Essential (primary) hypertension: Secondary | ICD-10-CM | POA: Diagnosis not present

## 2021-03-22 DIAGNOSIS — E785 Hyperlipidemia, unspecified: Secondary | ICD-10-CM | POA: Diagnosis not present

## 2021-03-22 DIAGNOSIS — Z8719 Personal history of other diseases of the digestive system: Secondary | ICD-10-CM | POA: Diagnosis not present

## 2021-03-22 DIAGNOSIS — Z87891 Personal history of nicotine dependence: Secondary | ICD-10-CM | POA: Diagnosis not present

## 2021-03-22 DIAGNOSIS — Z8546 Personal history of malignant neoplasm of prostate: Secondary | ICD-10-CM | POA: Diagnosis not present

## 2021-07-26 DIAGNOSIS — E119 Type 2 diabetes mellitus without complications: Secondary | ICD-10-CM | POA: Diagnosis not present

## 2021-07-26 DIAGNOSIS — E785 Hyperlipidemia, unspecified: Secondary | ICD-10-CM | POA: Diagnosis not present

## 2021-07-26 DIAGNOSIS — Z8546 Personal history of malignant neoplasm of prostate: Secondary | ICD-10-CM | POA: Diagnosis not present

## 2021-07-26 DIAGNOSIS — Z23 Encounter for immunization: Secondary | ICD-10-CM | POA: Diagnosis not present

## 2021-07-26 DIAGNOSIS — I10 Essential (primary) hypertension: Secondary | ICD-10-CM | POA: Diagnosis not present

## 2021-07-26 DIAGNOSIS — Z87891 Personal history of nicotine dependence: Secondary | ICD-10-CM | POA: Diagnosis not present

## 2021-07-26 DIAGNOSIS — Z8719 Personal history of other diseases of the digestive system: Secondary | ICD-10-CM | POA: Diagnosis not present

## 2021-08-07 DIAGNOSIS — L570 Actinic keratosis: Secondary | ICD-10-CM | POA: Diagnosis not present

## 2021-08-07 DIAGNOSIS — D485 Neoplasm of uncertain behavior of skin: Secondary | ICD-10-CM | POA: Diagnosis not present

## 2021-08-07 DIAGNOSIS — L218 Other seborrheic dermatitis: Secondary | ICD-10-CM | POA: Diagnosis not present

## 2021-08-07 DIAGNOSIS — L814 Other melanin hyperpigmentation: Secondary | ICD-10-CM | POA: Diagnosis not present

## 2021-08-07 DIAGNOSIS — D044 Carcinoma in situ of skin of scalp and neck: Secondary | ICD-10-CM | POA: Diagnosis not present

## 2021-08-07 DIAGNOSIS — L821 Other seborrheic keratosis: Secondary | ICD-10-CM | POA: Diagnosis not present

## 2021-08-07 DIAGNOSIS — D1801 Hemangioma of skin and subcutaneous tissue: Secondary | ICD-10-CM | POA: Diagnosis not present

## 2021-08-07 DIAGNOSIS — L918 Other hypertrophic disorders of the skin: Secondary | ICD-10-CM | POA: Diagnosis not present

## 2021-10-05 DIAGNOSIS — H532 Diplopia: Secondary | ICD-10-CM | POA: Diagnosis not present

## 2021-10-05 DIAGNOSIS — H02889 Meibomian gland dysfunction of unspecified eye, unspecified eyelid: Secondary | ICD-10-CM | POA: Diagnosis not present

## 2021-10-05 DIAGNOSIS — Z961 Presence of intraocular lens: Secondary | ICD-10-CM | POA: Diagnosis not present

## 2021-10-05 DIAGNOSIS — H5202 Hypermetropia, left eye: Secondary | ICD-10-CM | POA: Diagnosis not present

## 2021-10-05 DIAGNOSIS — H52223 Regular astigmatism, bilateral: Secondary | ICD-10-CM | POA: Diagnosis not present

## 2021-11-08 DIAGNOSIS — Z87891 Personal history of nicotine dependence: Secondary | ICD-10-CM | POA: Diagnosis not present

## 2021-11-08 DIAGNOSIS — I10 Essential (primary) hypertension: Secondary | ICD-10-CM | POA: Diagnosis not present

## 2021-11-08 DIAGNOSIS — Z1331 Encounter for screening for depression: Secondary | ICD-10-CM | POA: Diagnosis not present

## 2021-11-08 DIAGNOSIS — E785 Hyperlipidemia, unspecified: Secondary | ICD-10-CM | POA: Diagnosis not present

## 2021-11-08 DIAGNOSIS — Z8719 Personal history of other diseases of the digestive system: Secondary | ICD-10-CM | POA: Diagnosis not present

## 2021-11-08 DIAGNOSIS — E119 Type 2 diabetes mellitus without complications: Secondary | ICD-10-CM | POA: Diagnosis not present

## 2021-11-08 DIAGNOSIS — Z9181 History of falling: Secondary | ICD-10-CM | POA: Diagnosis not present

## 2021-11-08 DIAGNOSIS — Z8546 Personal history of malignant neoplasm of prostate: Secondary | ICD-10-CM | POA: Diagnosis not present

## 2021-11-21 DIAGNOSIS — L570 Actinic keratosis: Secondary | ICD-10-CM | POA: Diagnosis not present

## 2021-11-21 DIAGNOSIS — L218 Other seborrheic dermatitis: Secondary | ICD-10-CM | POA: Diagnosis not present

## 2021-11-21 DIAGNOSIS — D044 Carcinoma in situ of skin of scalp and neck: Secondary | ICD-10-CM | POA: Diagnosis not present

## 2021-11-21 DIAGNOSIS — Z85828 Personal history of other malignant neoplasm of skin: Secondary | ICD-10-CM | POA: Diagnosis not present

## 2021-11-21 DIAGNOSIS — D485 Neoplasm of uncertain behavior of skin: Secondary | ICD-10-CM | POA: Diagnosis not present

## 2022-02-06 DIAGNOSIS — Z8546 Personal history of malignant neoplasm of prostate: Secondary | ICD-10-CM | POA: Diagnosis not present

## 2022-02-13 DIAGNOSIS — Z8546 Personal history of malignant neoplasm of prostate: Secondary | ICD-10-CM | POA: Diagnosis not present

## 2022-02-13 DIAGNOSIS — N3946 Mixed incontinence: Secondary | ICD-10-CM | POA: Diagnosis not present

## 2022-03-08 DIAGNOSIS — I10 Essential (primary) hypertension: Secondary | ICD-10-CM | POA: Diagnosis not present

## 2022-03-08 DIAGNOSIS — E785 Hyperlipidemia, unspecified: Secondary | ICD-10-CM | POA: Diagnosis not present

## 2022-03-08 DIAGNOSIS — E119 Type 2 diabetes mellitus without complications: Secondary | ICD-10-CM | POA: Diagnosis not present

## 2022-03-08 DIAGNOSIS — Z139 Encounter for screening, unspecified: Secondary | ICD-10-CM | POA: Diagnosis not present

## 2022-03-08 DIAGNOSIS — Z8546 Personal history of malignant neoplasm of prostate: Secondary | ICD-10-CM | POA: Diagnosis not present

## 2022-03-22 DIAGNOSIS — Z0000 Encounter for general adult medical examination without abnormal findings: Secondary | ICD-10-CM | POA: Diagnosis not present

## 2022-03-22 DIAGNOSIS — Z1331 Encounter for screening for depression: Secondary | ICD-10-CM | POA: Diagnosis not present

## 2022-03-22 DIAGNOSIS — Z9181 History of falling: Secondary | ICD-10-CM | POA: Diagnosis not present

## 2022-03-22 DIAGNOSIS — E785 Hyperlipidemia, unspecified: Secondary | ICD-10-CM | POA: Diagnosis not present

## 2022-07-08 DIAGNOSIS — E785 Hyperlipidemia, unspecified: Secondary | ICD-10-CM | POA: Diagnosis not present

## 2022-07-08 DIAGNOSIS — H9121 Sudden idiopathic hearing loss, right ear: Secondary | ICD-10-CM | POA: Diagnosis not present

## 2022-07-08 DIAGNOSIS — E1169 Type 2 diabetes mellitus with other specified complication: Secondary | ICD-10-CM | POA: Diagnosis not present

## 2022-07-08 DIAGNOSIS — Z9889 Other specified postprocedural states: Secondary | ICD-10-CM | POA: Diagnosis not present

## 2022-07-08 DIAGNOSIS — Z23 Encounter for immunization: Secondary | ICD-10-CM | POA: Diagnosis not present

## 2022-07-08 DIAGNOSIS — I10 Essential (primary) hypertension: Secondary | ICD-10-CM | POA: Diagnosis not present

## 2022-08-05 DIAGNOSIS — L821 Other seborrheic keratosis: Secondary | ICD-10-CM | POA: Diagnosis not present

## 2022-08-05 DIAGNOSIS — Z85828 Personal history of other malignant neoplasm of skin: Secondary | ICD-10-CM | POA: Diagnosis not present

## 2022-08-05 DIAGNOSIS — D225 Melanocytic nevi of trunk: Secondary | ICD-10-CM | POA: Diagnosis not present

## 2022-08-05 DIAGNOSIS — L570 Actinic keratosis: Secondary | ICD-10-CM | POA: Diagnosis not present

## 2022-10-07 ENCOUNTER — Other Ambulatory Visit: Payer: Self-pay | Admitting: Physician Assistant

## 2022-10-07 DIAGNOSIS — H9311 Tinnitus, right ear: Secondary | ICD-10-CM

## 2022-10-07 DIAGNOSIS — H9121 Sudden idiopathic hearing loss, right ear: Secondary | ICD-10-CM | POA: Diagnosis not present

## 2022-10-07 DIAGNOSIS — Z77122 Contact with and (suspected) exposure to noise: Secondary | ICD-10-CM | POA: Diagnosis not present

## 2022-10-07 DIAGNOSIS — H903 Sensorineural hearing loss, bilateral: Secondary | ICD-10-CM | POA: Diagnosis not present

## 2022-10-08 DIAGNOSIS — H02889 Meibomian gland dysfunction of unspecified eye, unspecified eyelid: Secondary | ICD-10-CM | POA: Diagnosis not present

## 2022-10-08 DIAGNOSIS — H532 Diplopia: Secondary | ICD-10-CM | POA: Diagnosis not present

## 2022-10-08 DIAGNOSIS — Z961 Presence of intraocular lens: Secondary | ICD-10-CM | POA: Diagnosis not present

## 2022-10-08 DIAGNOSIS — H52221 Regular astigmatism, right eye: Secondary | ICD-10-CM | POA: Diagnosis not present

## 2022-10-08 DIAGNOSIS — H5203 Hypermetropia, bilateral: Secondary | ICD-10-CM | POA: Diagnosis not present

## 2022-10-23 ENCOUNTER — Ambulatory Visit
Admission: RE | Admit: 2022-10-23 | Discharge: 2022-10-23 | Disposition: A | Discharge status: Home / Self Care | Payer: Medicare HMO | Source: Ambulatory Visit | Attending: Physician Assistant | Admitting: Physician Assistant

## 2022-10-23 DIAGNOSIS — H9121 Sudden idiopathic hearing loss, right ear: Secondary | ICD-10-CM

## 2022-10-23 DIAGNOSIS — I6782 Cerebral ischemia: Secondary | ICD-10-CM | POA: Diagnosis not present

## 2022-10-23 DIAGNOSIS — H9311 Tinnitus, right ear: Secondary | ICD-10-CM

## 2022-10-23 DIAGNOSIS — H9120 Sudden idiopathic hearing loss, unspecified ear: Secondary | ICD-10-CM | POA: Diagnosis not present

## 2022-10-23 MED ORDER — GADOPICLENOL 0.5 MMOL/ML IV SOLN
9.0000 mL | Freq: Once | INTRAVENOUS | Status: AC | PRN
  Administered 2022-10-23: 9 mL via INTRAVENOUS

## 2022-11-21 DIAGNOSIS — H903 Sensorineural hearing loss, bilateral: Secondary | ICD-10-CM | POA: Diagnosis not present

## 2022-11-21 DIAGNOSIS — H9121 Sudden idiopathic hearing loss, right ear: Secondary | ICD-10-CM | POA: Diagnosis not present

## 2022-11-21 DIAGNOSIS — H9311 Tinnitus, right ear: Secondary | ICD-10-CM | POA: Diagnosis not present

## 2022-12-06 DIAGNOSIS — I10 Essential (primary) hypertension: Secondary | ICD-10-CM | POA: Diagnosis not present

## 2022-12-06 DIAGNOSIS — M25572 Pain in left ankle and joints of left foot: Secondary | ICD-10-CM | POA: Diagnosis not present

## 2022-12-06 DIAGNOSIS — H9311 Tinnitus, right ear: Secondary | ICD-10-CM | POA: Diagnosis not present

## 2022-12-06 DIAGNOSIS — H9121 Sudden idiopathic hearing loss, right ear: Secondary | ICD-10-CM | POA: Diagnosis not present

## 2022-12-06 DIAGNOSIS — M7732 Calcaneal spur, left foot: Secondary | ICD-10-CM | POA: Diagnosis not present

## 2022-12-06 DIAGNOSIS — M19072 Primary osteoarthritis, left ankle and foot: Secondary | ICD-10-CM | POA: Diagnosis not present

## 2022-12-06 DIAGNOSIS — H903 Sensorineural hearing loss, bilateral: Secondary | ICD-10-CM | POA: Diagnosis not present

## 2023-01-07 DIAGNOSIS — Z8546 Personal history of malignant neoplasm of prostate: Secondary | ICD-10-CM | POA: Diagnosis not present

## 2023-01-07 DIAGNOSIS — Z6827 Body mass index (BMI) 27.0-27.9, adult: Secondary | ICD-10-CM | POA: Diagnosis not present

## 2023-01-07 DIAGNOSIS — E1169 Type 2 diabetes mellitus with other specified complication: Secondary | ICD-10-CM | POA: Diagnosis not present

## 2023-01-07 DIAGNOSIS — I10 Essential (primary) hypertension: Secondary | ICD-10-CM | POA: Diagnosis not present

## 2023-01-07 DIAGNOSIS — E785 Hyperlipidemia, unspecified: Secondary | ICD-10-CM | POA: Diagnosis not present

## 2023-01-08 DIAGNOSIS — E1169 Type 2 diabetes mellitus with other specified complication: Secondary | ICD-10-CM | POA: Diagnosis not present

## 2023-01-08 DIAGNOSIS — Z8546 Personal history of malignant neoplasm of prostate: Secondary | ICD-10-CM | POA: Diagnosis not present

## 2023-01-08 DIAGNOSIS — E785 Hyperlipidemia, unspecified: Secondary | ICD-10-CM | POA: Diagnosis not present

## 2023-02-04 DIAGNOSIS — Z8546 Personal history of malignant neoplasm of prostate: Secondary | ICD-10-CM | POA: Diagnosis not present

## 2023-02-18 DIAGNOSIS — N3946 Mixed incontinence: Secondary | ICD-10-CM | POA: Diagnosis not present

## 2023-02-18 DIAGNOSIS — Z8546 Personal history of malignant neoplasm of prostate: Secondary | ICD-10-CM | POA: Diagnosis not present

## 2023-05-09 DIAGNOSIS — H903 Sensorineural hearing loss, bilateral: Secondary | ICD-10-CM | POA: Diagnosis not present

## 2023-07-10 DIAGNOSIS — Z23 Encounter for immunization: Secondary | ICD-10-CM | POA: Diagnosis not present

## 2023-07-10 DIAGNOSIS — Z8546 Personal history of malignant neoplasm of prostate: Secondary | ICD-10-CM | POA: Diagnosis not present

## 2023-07-10 DIAGNOSIS — Z6827 Body mass index (BMI) 27.0-27.9, adult: Secondary | ICD-10-CM | POA: Diagnosis not present

## 2023-07-10 DIAGNOSIS — I10 Essential (primary) hypertension: Secondary | ICD-10-CM | POA: Diagnosis not present

## 2023-07-10 DIAGNOSIS — E1169 Type 2 diabetes mellitus with other specified complication: Secondary | ICD-10-CM | POA: Diagnosis not present

## 2023-07-10 DIAGNOSIS — E785 Hyperlipidemia, unspecified: Secondary | ICD-10-CM | POA: Diagnosis not present

## 2023-07-10 DIAGNOSIS — Z139 Encounter for screening, unspecified: Secondary | ICD-10-CM | POA: Diagnosis not present

## 2023-07-10 DIAGNOSIS — G4762 Sleep related leg cramps: Secondary | ICD-10-CM | POA: Diagnosis not present

## 2023-07-25 DIAGNOSIS — R21 Rash and other nonspecific skin eruption: Secondary | ICD-10-CM | POA: Diagnosis not present

## 2023-09-08 DIAGNOSIS — Z0000 Encounter for general adult medical examination without abnormal findings: Secondary | ICD-10-CM | POA: Diagnosis not present

## 2023-09-08 DIAGNOSIS — Z9181 History of falling: Secondary | ICD-10-CM | POA: Diagnosis not present

## 2023-09-11 DIAGNOSIS — L570 Actinic keratosis: Secondary | ICD-10-CM | POA: Diagnosis not present

## 2023-09-11 DIAGNOSIS — L218 Other seborrheic dermatitis: Secondary | ICD-10-CM | POA: Diagnosis not present

## 2023-09-11 DIAGNOSIS — L821 Other seborrheic keratosis: Secondary | ICD-10-CM | POA: Diagnosis not present

## 2023-10-21 DIAGNOSIS — H524 Presbyopia: Secondary | ICD-10-CM | POA: Diagnosis not present

## 2023-10-21 DIAGNOSIS — H40013 Open angle with borderline findings, low risk, bilateral: Secondary | ICD-10-CM | POA: Diagnosis not present

## 2023-10-21 DIAGNOSIS — H02889 Meibomian gland dysfunction of unspecified eye, unspecified eyelid: Secondary | ICD-10-CM | POA: Diagnosis not present

## 2023-10-21 DIAGNOSIS — Z961 Presence of intraocular lens: Secondary | ICD-10-CM | POA: Diagnosis not present

## 2023-10-21 DIAGNOSIS — H5203 Hypermetropia, bilateral: Secondary | ICD-10-CM | POA: Diagnosis not present

## 2023-10-21 DIAGNOSIS — H52223 Regular astigmatism, bilateral: Secondary | ICD-10-CM | POA: Diagnosis not present

## 2024-01-08 DIAGNOSIS — G4762 Sleep related leg cramps: Secondary | ICD-10-CM | POA: Diagnosis not present

## 2024-01-08 DIAGNOSIS — R21 Rash and other nonspecific skin eruption: Secondary | ICD-10-CM | POA: Diagnosis not present

## 2024-01-08 DIAGNOSIS — E785 Hyperlipidemia, unspecified: Secondary | ICD-10-CM | POA: Diagnosis not present

## 2024-01-08 DIAGNOSIS — E1169 Type 2 diabetes mellitus with other specified complication: Secondary | ICD-10-CM | POA: Diagnosis not present

## 2024-01-08 DIAGNOSIS — Z8546 Personal history of malignant neoplasm of prostate: Secondary | ICD-10-CM | POA: Diagnosis not present

## 2024-01-09 DIAGNOSIS — I10 Essential (primary) hypertension: Secondary | ICD-10-CM | POA: Diagnosis not present

## 2024-01-09 DIAGNOSIS — E1169 Type 2 diabetes mellitus with other specified complication: Secondary | ICD-10-CM | POA: Diagnosis not present

## 2024-01-09 DIAGNOSIS — Z8546 Personal history of malignant neoplasm of prostate: Secondary | ICD-10-CM | POA: Diagnosis not present

## 2024-01-09 DIAGNOSIS — E785 Hyperlipidemia, unspecified: Secondary | ICD-10-CM | POA: Diagnosis not present

## 2024-02-20 DIAGNOSIS — Z8546 Personal history of malignant neoplasm of prostate: Secondary | ICD-10-CM | POA: Diagnosis not present

## 2024-02-27 DIAGNOSIS — N3946 Mixed incontinence: Secondary | ICD-10-CM | POA: Diagnosis not present

## 2024-02-27 DIAGNOSIS — C61 Malignant neoplasm of prostate: Secondary | ICD-10-CM | POA: Diagnosis not present

## 2024-07-12 DIAGNOSIS — Z8546 Personal history of malignant neoplasm of prostate: Secondary | ICD-10-CM | POA: Diagnosis not present

## 2024-07-12 DIAGNOSIS — G4762 Sleep related leg cramps: Secondary | ICD-10-CM | POA: Diagnosis not present

## 2024-07-12 DIAGNOSIS — Z125 Encounter for screening for malignant neoplasm of prostate: Secondary | ICD-10-CM | POA: Diagnosis not present

## 2024-07-12 DIAGNOSIS — E1169 Type 2 diabetes mellitus with other specified complication: Secondary | ICD-10-CM | POA: Diagnosis not present

## 2024-07-12 DIAGNOSIS — E785 Hyperlipidemia, unspecified: Secondary | ICD-10-CM | POA: Diagnosis not present

## 2024-07-12 DIAGNOSIS — I10 Essential (primary) hypertension: Secondary | ICD-10-CM | POA: Diagnosis not present

## 2024-07-12 DIAGNOSIS — Z23 Encounter for immunization: Secondary | ICD-10-CM | POA: Diagnosis not present

## 2024-09-08 DIAGNOSIS — Z0000 Encounter for general adult medical examination without abnormal findings: Secondary | ICD-10-CM | POA: Diagnosis not present

## 2024-09-08 DIAGNOSIS — Z9181 History of falling: Secondary | ICD-10-CM | POA: Diagnosis not present
